# Patient Record
Sex: Male | Born: 1940 | Race: White | Hispanic: No | Marital: Married | Smoking: Never smoker
Health system: Southern US, Community
[De-identification: ages and names within clinical notes are randomized; demographics above are authoritative.]

## PROBLEM LIST (undated history)

## (undated) DIAGNOSIS — Z85828 Personal history of other malignant neoplasm of skin: Secondary | ICD-10-CM

## (undated) DIAGNOSIS — K219 Gastro-esophageal reflux disease without esophagitis: Secondary | ICD-10-CM

## (undated) DIAGNOSIS — R0981 Nasal congestion: Secondary | ICD-10-CM

## (undated) DIAGNOSIS — C801 Malignant (primary) neoplasm, unspecified: Secondary | ICD-10-CM

## (undated) DIAGNOSIS — E785 Hyperlipidemia, unspecified: Secondary | ICD-10-CM

## (undated) DIAGNOSIS — I10 Essential (primary) hypertension: Secondary | ICD-10-CM

## (undated) HISTORY — DX: Nasal congestion: R09.81

## (undated) HISTORY — DX: Gastro-esophageal reflux disease without esophagitis: K21.9

## (undated) HISTORY — DX: Personal history of other malignant neoplasm of skin: Z85.828

## (undated) HISTORY — DX: Essential (primary) hypertension: I10

## (undated) HISTORY — PX: DEBRIDEMENT TENNIS ELBOW: SHX1442

## (undated) HISTORY — DX: Malignant (primary) neoplasm, unspecified: C80.1

## (undated) HISTORY — DX: Hyperlipidemia, unspecified: E78.5

## (undated) HISTORY — PX: ROTATOR CUFF REPAIR: SHX139

---

## 1999-07-03 ENCOUNTER — Ambulatory Visit (HOSPITAL_COMMUNITY): Admission: RE | Admit: 1999-07-03 | Discharge: 1999-07-03 | Payer: Self-pay | Admitting: Internal Medicine

## 1999-07-03 ENCOUNTER — Encounter (INDEPENDENT_AMBULATORY_CARE_PROVIDER_SITE_OTHER): Payer: Self-pay | Admitting: Specialist

## 1999-07-09 ENCOUNTER — Other Ambulatory Visit: Admission: RE | Admit: 1999-07-09 | Discharge: 1999-07-09 | Payer: Self-pay | Admitting: Urology

## 2003-12-26 ENCOUNTER — Ambulatory Visit: Payer: Self-pay | Admitting: Family Medicine

## 2004-04-01 ENCOUNTER — Ambulatory Visit: Payer: Self-pay | Admitting: Family Medicine

## 2004-04-15 ENCOUNTER — Ambulatory Visit: Payer: Self-pay | Admitting: Family Medicine

## 2004-07-15 ENCOUNTER — Ambulatory Visit: Payer: Self-pay | Admitting: Family Medicine

## 2004-09-01 ENCOUNTER — Ambulatory Visit: Payer: Self-pay | Admitting: Family Medicine

## 2004-12-04 ENCOUNTER — Ambulatory Visit: Payer: Self-pay | Admitting: Family Medicine

## 2004-12-09 ENCOUNTER — Ambulatory Visit: Payer: Self-pay | Admitting: Family Medicine

## 2005-02-16 HISTORY — PX: SHOULDER SURGERY: SHX246

## 2005-04-14 ENCOUNTER — Ambulatory Visit: Payer: Self-pay | Admitting: Family Medicine

## 2005-06-15 ENCOUNTER — Ambulatory Visit: Payer: Self-pay | Admitting: Family Medicine

## 2005-09-23 ENCOUNTER — Ambulatory Visit: Payer: Self-pay | Admitting: Family Medicine

## 2005-10-07 ENCOUNTER — Ambulatory Visit (HOSPITAL_COMMUNITY): Admission: RE | Admit: 2005-10-07 | Discharge: 2005-10-07 | Payer: Self-pay | Admitting: Family Medicine

## 2006-02-01 ENCOUNTER — Encounter: Admission: RE | Admit: 2006-02-01 | Discharge: 2006-02-15 | Payer: Self-pay | Admitting: Orthopaedic Surgery

## 2006-02-16 ENCOUNTER — Encounter: Admission: RE | Admit: 2006-02-16 | Discharge: 2006-03-12 | Payer: Self-pay | Admitting: Orthopaedic Surgery

## 2006-07-08 ENCOUNTER — Ambulatory Visit: Payer: Self-pay | Admitting: Family Medicine

## 2006-10-20 ENCOUNTER — Ambulatory Visit: Payer: Self-pay | Admitting: Internal Medicine

## 2006-11-03 ENCOUNTER — Ambulatory Visit: Payer: Self-pay | Admitting: Internal Medicine

## 2006-11-03 ENCOUNTER — Encounter: Payer: Self-pay | Admitting: Internal Medicine

## 2010-07-04 NOTE — Procedures (Signed)
[place]  Patient:    Vincent Obrien, Vincent Obrien                           MRN: [id number] Proc. Date: 07/03/99 Adm. Date:  [id number] Disc. Date: [id number] Attending:  Estella Husk CC:         Delaney Meigs, M.D.             Verl Dicker, M.D.                           Procedure Report  PROCEDURE PERFORMED:  Colonoscopy with polypectomy.  ENDOSCOPIST:  Wilhemina Bonito. Eda Keys., M.D. [facility]  INDICATIONS FOR PROCEDURE: Colorectal neoplasia screening.  HISTORY:  The patient is a 70 year old gentleman in good health who was recently evaluated in the office for colorectal neoplasia screening.  This is after he was recently found to have a nodule on his prostate.  He is now for colonoscopy.  The nature of the procedure as well as the risks, benefits and alternatives were reviewed.   He understood and agreed to proceed.  PHYSICAL EXAMINATION:  The patient is a well-appearing male in no acute distress.  He is alert and oriented.  Vital signs are stable.  Lungs are clear.  Heart is regular.  Abdomen soft.  DESCRIPTION OF PROCEDURE:  After informed consent was obtained, the patient was sedated with 75 mg of Demerol and 7.5 mg of Versed IV.  Digital rectal was performed and found to be remarkable for the presence of a 5 mm firm, prostate nodule in the left lobe of the gland.  The Olympus colonoscope was then passed under direct vision per rectum and advanced through the entire length of the colon to the cecal tip.  Preparation was excellent.  Terminal ileum was intubated and appeared normal.  Careful examination of the colonic mucosa from the cecal tip to rectum revealed a 6 mm midascending colon polyp. This was removed with snare cautery and submitted for pathologic analysis.  Retroflex view of the rectum demonstrated internal hemorrhoids.  No other abnormalities.  IMPRESSION: 1. Diminutive colon polyp status post polypectomy. 2. Internal  hemorrhoids.  RECOMMENDATIONS: 1. Follow up pathology. 2. Repeat colonoscopy in three years if polyp adenomatous. 3. Avoid aspirin and nonsteroidal products for two weeks postpolypectomy. DD:  07/03/99 TD:  07/08/99 Job: [id number]

## 2011-11-20 ENCOUNTER — Encounter: Payer: Self-pay | Admitting: Internal Medicine

## 2012-05-13 ENCOUNTER — Encounter: Payer: Self-pay | Admitting: Internal Medicine

## 2012-06-28 ENCOUNTER — Ambulatory Visit (AMBULATORY_SURGERY_CENTER): Payer: PRIVATE HEALTH INSURANCE

## 2012-06-28 VITALS — Ht 66.0 in | Wt 154.8 lb

## 2012-06-28 DIAGNOSIS — Z1211 Encounter for screening for malignant neoplasm of colon: Secondary | ICD-10-CM

## 2012-06-28 DIAGNOSIS — Z860100 Personal history of colon polyps, unspecified: Secondary | ICD-10-CM

## 2012-06-28 MED ORDER — MOVIPREP 100 G PO SOLR: ORAL | Status: DC

## 2012-07-01 ENCOUNTER — Encounter: Payer: Self-pay | Admitting: Internal Medicine

## 2012-07-12 ENCOUNTER — Encounter: Payer: Self-pay | Admitting: Internal Medicine

## 2012-07-12 ENCOUNTER — Ambulatory Visit (AMBULATORY_SURGERY_CENTER): Payer: PRIVATE HEALTH INSURANCE | Admitting: Internal Medicine

## 2012-07-12 VITALS — BP 149/83 | HR 56 | Temp 98.3°F | Resp 17 | Ht 66.0 in | Wt 154.0 lb

## 2012-07-12 DIAGNOSIS — D126 Benign neoplasm of colon, unspecified: Secondary | ICD-10-CM

## 2012-07-12 DIAGNOSIS — Z1211 Encounter for screening for malignant neoplasm of colon: Secondary | ICD-10-CM

## 2012-07-12 DIAGNOSIS — Z860100 Personal history of colon polyps, unspecified: Secondary | ICD-10-CM

## 2012-07-12 MED ORDER — SODIUM CHLORIDE 0.9 % IV SOLN: 500.0000 mL | INTRAVENOUS | Status: DC

## 2012-07-12 NOTE — Patient Instructions (Addendum)

## 2012-07-12 NOTE — Op Note (Signed)
[facility] [address] [place], [address]   COLONOSCOPY PROCEDURE REPORT  PATIENT: Vincent Obrien, Vincent Obrien  MR#: [id number] BIRTHDATE: [birth year] , 72  yrs. old GENDER: Male ENDOSCOPIST: Roxy Cedar, MD REFERRED IR:[id number] Program Recall PROCEDURE DATE:  07/12/2012 PROCEDURE:   Colonoscopy with snare polypectomy x 1 ASA CLASS:   Class II INDICATIONS:Patient's personal history of adenomatous colon polyps. Prior exams 2001, 04, 08 (TAs) MEDICATIONS: MAC sedation, administered by CRNA and propofol (Diprivan) 200mg  IV  DESCRIPTION OF PROCEDURE:   After the risks benefits and alternatives of the procedure were thoroughly explained, informed consent was obtained.  A digital rectal exam revealed no abnormalities of the rectum.   The LB [id number]  endoscope was introduced through the anus and advanced to the cecum, which was identified by both the appendix and ileocecal valve. No adverse events experienced.   The quality of the prep was excellent, using MoviPrep  The instrument was then slowly withdrawn as the colon was fully examined.      COLON FINDINGS: A diminutive polyp was found in the ascending colon. A polypectomy was performed with a cold snare.  The resection was complete and the polyp tissue was completely retrieved.   Mild diverticulosis was noted in the sigmoid colon.   The colon mucosa was otherwise normal.  Retroflexed views revealed internal hemorrhoids. The time to cecum=3 minutes 0 seconds.  Withdrawal time=8 minutes 35 seconds.  The scope was withdrawn and the procedure completed. COMPLICATIONS: There were no complications.  ENDOSCOPIC IMPRESSION: 1.   Diminutive polyp was found in the ascending colon; polypectomy was performed with a cold snare 2.   Mild diverticulosis was noted in the sigmoid colon 3.   The colon mucosa was otherwise normal  RECOMMENDATIONS: 1. Consider Follow up colonoscopy in 5 years if medically  fit   eSigned:  Roxy Cedar, MD 07/12/2012 11:14 AM   cc: Joette Catching, MD and The Patient   PATIENT NAME:  Vincent Obrien, Vincent Obrien MR#: [id number]

## 2012-07-12 NOTE — Progress Notes (Signed)
Patient did not experience any of the following events: a burn prior to discharge; a fall within the facility; wrong site/side/patient/procedure/implant event; or a hospital transfer or hospital admission upon discharge from the facility. (G8907) Patient did not have preoperative order for IV antibiotic SSI prophylaxis. (G8918)  

## 2012-07-12 NOTE — Progress Notes (Signed)
NO EGG OR SOY ALLERGY. EWM 

## 2012-07-12 NOTE — Progress Notes (Signed)
Called to room to assist during endoscopic procedure.  Patient ID and intended procedure confirmed with present staff. Received instructions for my participation in the procedure from the performing physician.  

## 2012-07-18 ENCOUNTER — Encounter: Payer: Self-pay | Admitting: Internal Medicine

## 2013-09-04 DIAGNOSIS — E785 Hyperlipidemia, unspecified: Secondary | ICD-10-CM | POA: Insufficient documentation

## 2013-09-04 DIAGNOSIS — I129 Hypertensive chronic kidney disease with stage 1 through stage 4 chronic kidney disease, or unspecified chronic kidney disease: Secondary | ICD-10-CM | POA: Insufficient documentation

## 2013-10-31 ENCOUNTER — Encounter: Payer: Self-pay | Admitting: Internal Medicine

## 2015-04-01 DIAGNOSIS — H903 Sensorineural hearing loss, bilateral: Secondary | ICD-10-CM | POA: Insufficient documentation

## 2015-10-07 DIAGNOSIS — K219 Gastro-esophageal reflux disease without esophagitis: Secondary | ICD-10-CM | POA: Insufficient documentation

## 2016-01-01 ENCOUNTER — Other Ambulatory Visit: Payer: Self-pay | Admitting: Urology

## 2016-01-01 ENCOUNTER — Ambulatory Visit (INDEPENDENT_AMBULATORY_CARE_PROVIDER_SITE_OTHER): Payer: Medicare Other | Admitting: Urology

## 2016-01-01 DIAGNOSIS — R972 Elevated prostate specific antigen [PSA]: Secondary | ICD-10-CM

## 2016-01-01 DIAGNOSIS — N5201 Erectile dysfunction due to arterial insufficiency: Secondary | ICD-10-CM

## 2016-01-29 ENCOUNTER — Ambulatory Visit (HOSPITAL_COMMUNITY)
Admission: RE | Admit: 2016-01-29 | Discharge: 2016-01-29 | Disposition: A | Discharge status: Home / Self Care | Payer: Medicare Other | Source: Ambulatory Visit | Attending: Urology | Admitting: Urology

## 2016-01-29 ENCOUNTER — Ambulatory Visit (HOSPITAL_COMMUNITY): Admission: RE | Admit: 2016-01-29 | Payer: Medicare Other | Source: Ambulatory Visit

## 2016-01-29 DIAGNOSIS — C61 Malignant neoplasm of prostate: Secondary | ICD-10-CM | POA: Diagnosis not present

## 2016-01-29 DIAGNOSIS — R972 Elevated prostate specific antigen [PSA]: Secondary | ICD-10-CM | POA: Insufficient documentation

## 2016-01-29 MED ORDER — GENTAMICIN SULFATE 40 MG/ML IJ SOLN
INTRAMUSCULAR | Status: AC
  Administered 2016-01-29: 80 mg via INTRAMUSCULAR
  Filled 2016-01-29: qty 2

## 2016-01-29 MED ORDER — LIDOCAINE HCL (PF) 2 % IJ SOLN
INTRAMUSCULAR | Status: AC
  Administered 2016-01-29: 10 mL
  Filled 2016-01-29: qty 10

## 2016-01-29 MED ORDER — GENTAMICIN SULFATE 40 MG/ML IJ SOLN
160.0000 mg | Freq: Once | INTRAMUSCULAR | Status: AC
  Administered 2016-01-29: 80 mg via INTRAMUSCULAR

## 2016-01-29 NOTE — Discharge Instructions (Signed)
Transrectal Ultrasound-Guided Biopsy °A transrectal ultrasound-guided biopsy is a procedure to take samples of tissue from your prostate. Ultrasound images are used to guide the procedure. It is usually done to check the prostate gland for cancer. °What happens before the procedure? °· Do not eat or drink after midnight on the night before your procedure. °· Take medicines as your doctor tells you. °· Your doctor may have you stop taking some medicines 5-7 days before the procedure. °· You will be given an enema before your procedure. During an enema, a liquid is put into your butt (rectum) to clear out waste. °· You may have lab tests the day of your procedure. °· Make plans to have someone drive you home. °What happens during the procedure? °· You will be given medicine to help you relax before the procedure. An IV tube will be put into one of your veins. It will be used to give fluids and medicine. °· You will be given medicine to reduce the risk of infection (antibiotic). °· You will be placed on your side. °· A probe with gel will be put in your butt. This is used to take pictures of your prostate and the area around it. °· A medicine to numb the area is put into your prostate. °· A biopsy needle is then inserted and guided to your prostate. °· Samples of prostate tissue are taken. The needle is removed. °· The samples are sent to a lab to be checked. Results are usually back in 2-3 days. °What happens after the procedure? °· You will be taken to a room where you will be watched until you are doing okay. °· You may have some pain in the area around your butt. You will be given medicines for this. °· You may be able to go home the same day. Sometimes, an overnight stay in the hospital is needed. °This information is not intended to replace advice given to you by your health care provider. Make sure you discuss any questions you have with your health care provider. °Document Released: 01/21/2009 Document Revised:  07/11/2015 Document Reviewed: 09/21/2012 °Elsevier Interactive Patient Education © 2017 Elsevier Inc. ° °

## 2016-02-26 ENCOUNTER — Ambulatory Visit (INDEPENDENT_AMBULATORY_CARE_PROVIDER_SITE_OTHER): Payer: Medicare Other | Admitting: Urology

## 2016-02-26 DIAGNOSIS — C61 Malignant neoplasm of prostate: Secondary | ICD-10-CM | POA: Diagnosis not present

## 2016-03-03 ENCOUNTER — Other Ambulatory Visit: Payer: Self-pay | Admitting: Urology

## 2016-03-03 DIAGNOSIS — C61 Malignant neoplasm of prostate: Secondary | ICD-10-CM

## 2016-03-10 ENCOUNTER — Encounter (HOSPITAL_COMMUNITY)
Admission: RE | Admit: 2016-03-10 | Discharge: 2016-03-10 | Disposition: A | Discharge status: Home / Self Care | Payer: Medicare Other | Source: Ambulatory Visit | Attending: Urology | Admitting: Urology

## 2016-03-10 ENCOUNTER — Encounter (HOSPITAL_COMMUNITY): Payer: Self-pay

## 2016-03-10 DIAGNOSIS — C61 Malignant neoplasm of prostate: Secondary | ICD-10-CM | POA: Diagnosis present

## 2016-03-10 MED ORDER — TECHNETIUM TC 99M MEDRONATE IV KIT
20.0000 | PACK | Freq: Once | INTRAVENOUS | Status: AC | PRN
  Administered 2016-03-10: 21.5 via INTRAVENOUS

## 2016-03-17 ENCOUNTER — Ambulatory Visit (HOSPITAL_COMMUNITY)
Admission: RE | Admit: 2016-03-17 | Discharge: 2016-03-17 | Disposition: A | Discharge status: Home / Self Care | Payer: Medicare Other | Source: Ambulatory Visit | Attending: Urology | Admitting: Urology

## 2016-03-17 DIAGNOSIS — C61 Malignant neoplasm of prostate: Secondary | ICD-10-CM | POA: Insufficient documentation

## 2016-03-17 MED ORDER — IOPAMIDOL (ISOVUE-300) INJECTION 61%
100.0000 mL | Freq: Once | INTRAVENOUS | Status: AC | PRN
  Administered 2016-03-17: 100 mL via INTRAVENOUS

## 2016-03-17 MED ORDER — BARIUM SULFATE 2.1 % PO SUSP
ORAL | Status: AC
  Filled 2016-03-17: qty 2

## 2016-04-03 ENCOUNTER — Other Ambulatory Visit: Payer: Self-pay | Admitting: Urology

## 2016-04-03 ENCOUNTER — Telehealth: Payer: Self-pay | Admitting: *Deleted

## 2016-04-03 DIAGNOSIS — C61 Malignant neoplasm of prostate: Secondary | ICD-10-CM

## 2016-04-03 NOTE — Telephone Encounter (Signed)
CALLED PATIENT TO INFORM OF SIM ON 05-29-16 @ 3 PM @ DR. MANNING'S OFFICE, LVM  FOR A RETURN CALL

## 2016-04-10 ENCOUNTER — Telehealth: Payer: Self-pay | Admitting: Urology

## 2016-04-10 NOTE — Telephone Encounter (Signed)
I have made multiple attempts to contact Mr Haneline without success. VM has not previously been available but I was able to leave a VM on patients home phone today requesting that he return my call to clarify who he has seen in [place]. Onc previously as we do not have any documentation within our system to indicate that he has been seen by Dr. Tammi Klippel.  It is suspected that he has been seen by Dr. Lianne Cure in [place] but need to confirm.  I will await his return call.  -Dyanara Cozza

## 2016-05-06 ENCOUNTER — Ambulatory Visit (INDEPENDENT_AMBULATORY_CARE_PROVIDER_SITE_OTHER): Payer: Medicare Other | Admitting: Urology

## 2016-05-06 DIAGNOSIS — C61 Malignant neoplasm of prostate: Secondary | ICD-10-CM | POA: Diagnosis not present

## 2016-05-11 ENCOUNTER — Other Ambulatory Visit: Payer: Self-pay | Admitting: Urology

## 2016-05-11 DIAGNOSIS — C61 Malignant neoplasm of prostate: Secondary | ICD-10-CM

## 2016-05-13 ENCOUNTER — Ambulatory Visit (HOSPITAL_COMMUNITY)
Admission: RE | Admit: 2016-05-13 | Discharge: 2016-05-13 | Disposition: A | Discharge status: Home / Self Care | Payer: Medicare Other | Source: Ambulatory Visit | Attending: Urology | Admitting: Urology

## 2016-05-13 DIAGNOSIS — C61 Malignant neoplasm of prostate: Secondary | ICD-10-CM | POA: Diagnosis not present

## 2016-05-13 MED ORDER — GENTAMICIN SULFATE 40 MG/ML IJ SOLN
INTRAMUSCULAR | Status: AC
  Administered 2016-05-13: 80 mg via INTRAMUSCULAR
  Filled 2016-05-13: qty 2

## 2016-05-13 MED ORDER — LIDOCAINE HCL (PF) 2 % IJ SOLN
INTRAMUSCULAR | Status: AC
  Administered 2016-05-13: 10 mL
  Filled 2016-05-13: qty 10

## 2016-05-13 MED ORDER — GENTAMICIN SULFATE 40 MG/ML IJ SOLN
80.0000 mg | Freq: Once | INTRAMUSCULAR | Status: AC
  Administered 2016-05-13: 80 mg via INTRAMUSCULAR

## 2016-05-13 MED ORDER — LIDOCAINE HCL (PF) 2 % IJ SOLN
10.0000 mL | Freq: Once | INTRAMUSCULAR | Status: AC
  Administered 2016-05-13: 10 mL

## 2016-05-13 NOTE — Discharge Instructions (Signed)
Transrectal Ultrasound-Guided Biopsy °A transrectal ultrasound-guided biopsy is a procedure to take samples of tissue from your prostate. Ultrasound images are used to guide the procedure. It is usually done to check the prostate gland for cancer. °What happens before the procedure? °· Do not eat or drink after midnight on the night before your procedure. °· Take medicines as your doctor tells you. °· Your doctor may have you stop taking some medicines 5-7 days before the procedure. °· You will be given an enema before your procedure. During an enema, a liquid is put into your butt (rectum) to clear out waste. °· You may have lab tests the day of your procedure. °· Make plans to have someone drive you home. °What happens during the procedure? °· You will be given medicine to help you relax before the procedure. An IV tube will be put into one of your veins. It will be used to give fluids and medicine. °· You will be given medicine to reduce the risk of infection (antibiotic). °· You will be placed on your side. °· A probe with gel will be put in your butt. This is used to take pictures of your prostate and the area around it. °· A medicine to numb the area is put into your prostate. °· A biopsy needle is then inserted and guided to your prostate. °· Samples of prostate tissue are taken. The needle is removed. °· The samples are sent to a lab to be checked. Results are usually back in 2-3 days. °What happens after the procedure? °· You will be taken to a room where you will be watched until you are doing okay. °· You may have some pain in the area around your butt. You will be given medicines for this. °· You may be able to go home the same day. Sometimes, an overnight stay in the hospital is needed. °This information is not intended to replace advice given to you by your health care provider. Make sure you discuss any questions you have with your health care provider. °Document Released: 01/21/2009 Document Revised:  07/11/2015 Document Reviewed: 09/21/2012 °Elsevier Interactive Patient Education © 2017 Elsevier Inc. ° °

## 2016-05-29 ENCOUNTER — Other Ambulatory Visit: Payer: Self-pay

## 2016-06-03 ENCOUNTER — Ambulatory Visit (INDEPENDENT_AMBULATORY_CARE_PROVIDER_SITE_OTHER): Payer: Medicare Other | Admitting: Urology

## 2016-06-03 DIAGNOSIS — C61 Malignant neoplasm of prostate: Secondary | ICD-10-CM | POA: Diagnosis not present

## 2016-08-05 ENCOUNTER — Ambulatory Visit (INDEPENDENT_AMBULATORY_CARE_PROVIDER_SITE_OTHER): Payer: Medicare Other | Admitting: Urology

## 2016-08-05 DIAGNOSIS — C61 Malignant neoplasm of prostate: Secondary | ICD-10-CM

## 2016-11-18 ENCOUNTER — Ambulatory Visit (INDEPENDENT_AMBULATORY_CARE_PROVIDER_SITE_OTHER): Payer: Medicare Other | Admitting: Urology

## 2016-11-18 DIAGNOSIS — C61 Malignant neoplasm of prostate: Secondary | ICD-10-CM

## 2017-03-03 ENCOUNTER — Ambulatory Visit (INDEPENDENT_AMBULATORY_CARE_PROVIDER_SITE_OTHER): Payer: Medicare Other | Admitting: Urology

## 2017-03-03 DIAGNOSIS — N400 Enlarged prostate without lower urinary tract symptoms: Secondary | ICD-10-CM

## 2017-03-03 DIAGNOSIS — N401 Enlarged prostate with lower urinary tract symptoms: Secondary | ICD-10-CM

## 2017-03-03 DIAGNOSIS — C61 Malignant neoplasm of prostate: Secondary | ICD-10-CM

## 2017-03-03 DIAGNOSIS — N5201 Erectile dysfunction due to arterial insufficiency: Secondary | ICD-10-CM

## 2017-06-09 ENCOUNTER — Ambulatory Visit (INDEPENDENT_AMBULATORY_CARE_PROVIDER_SITE_OTHER): Payer: Medicare Other | Admitting: Urology

## 2017-06-09 DIAGNOSIS — C61 Malignant neoplasm of prostate: Secondary | ICD-10-CM

## 2017-06-09 DIAGNOSIS — N5201 Erectile dysfunction due to arterial insufficiency: Secondary | ICD-10-CM

## 2017-06-09 DIAGNOSIS — N400 Enlarged prostate without lower urinary tract symptoms: Secondary | ICD-10-CM | POA: Diagnosis not present

## 2017-06-09 DIAGNOSIS — N401 Enlarged prostate with lower urinary tract symptoms: Secondary | ICD-10-CM

## 2017-06-23 ENCOUNTER — Encounter: Payer: Self-pay | Admitting: Internal Medicine

## 2017-08-27 ENCOUNTER — Ambulatory Visit (INDEPENDENT_AMBULATORY_CARE_PROVIDER_SITE_OTHER): Payer: Medicare Other | Admitting: Urology

## 2017-08-27 ENCOUNTER — Other Ambulatory Visit (HOSPITAL_COMMUNITY)
Admission: RE | Admit: 2017-08-27 | Discharge: 2017-08-27 | Disposition: A | Discharge status: Home / Self Care | Payer: Medicare Other | Source: Other Acute Inpatient Hospital | Attending: Urology | Admitting: Urology

## 2017-08-27 DIAGNOSIS — R310 Gross hematuria: Secondary | ICD-10-CM

## 2017-08-27 DIAGNOSIS — C61 Malignant neoplasm of prostate: Secondary | ICD-10-CM | POA: Diagnosis not present

## 2017-08-28 LAB — URINE CULTURE: CULTURE: NO GROWTH

## 2017-08-31 ENCOUNTER — Other Ambulatory Visit: Payer: Self-pay | Admitting: Urology

## 2017-08-31 DIAGNOSIS — R310 Gross hematuria: Secondary | ICD-10-CM

## 2017-09-03 IMAGING — NM NM BONE WHOLE BODY
4 series · 4 of 4 positions shown · non-contrast
Comparison: None.

CLINICAL DATA: Prostate cancer.

EXAM:
NUCLEAR MEDICINE WHOLE BODY BONE SCAN
TECHNIQUE: Whole body anterior and posterior images were obtained approximately
3 hours after intravenous injection of radiopharmaceutical.
RADIOPHARMACEUTICALS:  21.5 mCi Lechnetium-GGm MDP IV

[Series 1: whole body · 2.66mm/px · 1 of 1 slices shown (1 of 2)]
[im 1/1]
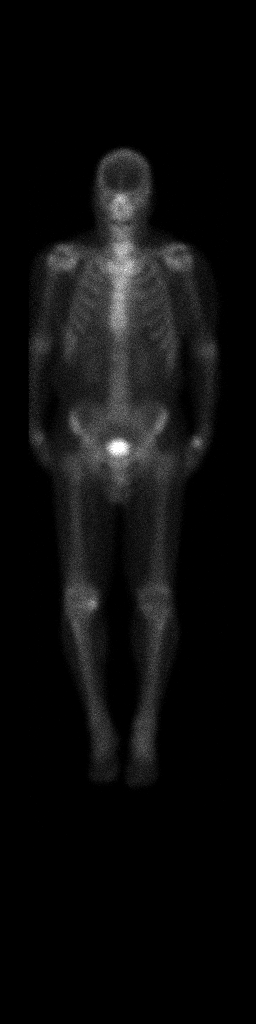

[Series 1: wbr_bone_60 whole body · 2.66mm/px · 1 of 1 slices shown (1 of 2)]
[im 1/1]
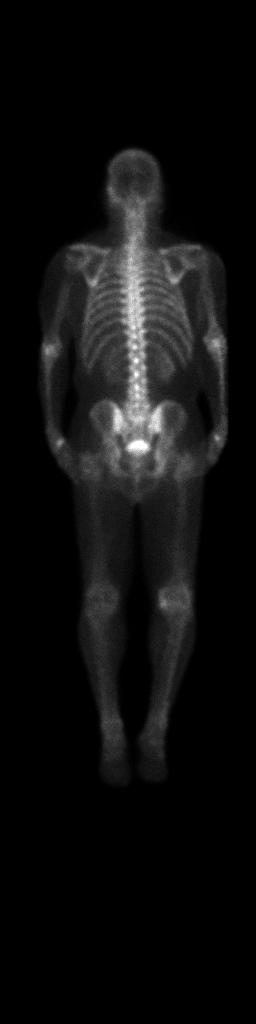

[Series 1: wbr_bone_60 whole body · 2.66mm/px · 1 of 1 slices shown (2 of 2)]
[im 1/1]
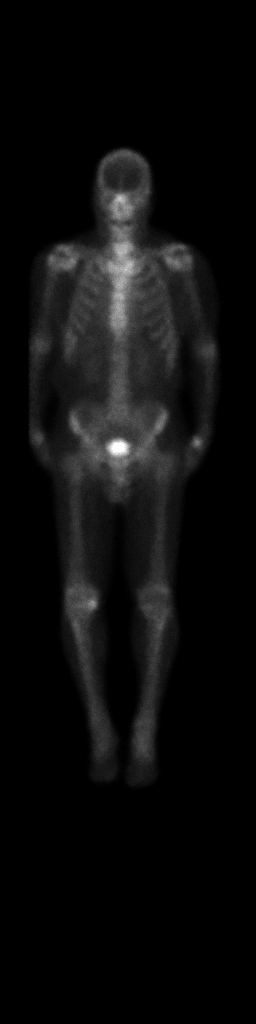

[Series 1: whole body · 2.66mm/px · 1 of 1 slices shown (2 of 2)]
[im 1/1]
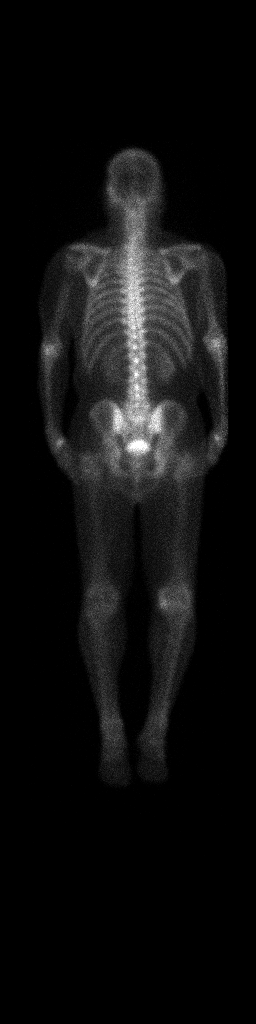

[4 of 4 positions shown; findings below may reference images not displayed]

FINDINGS: No areas of focal bony uptake to suggest osseous metastatic disease.
Degenerative uptake noted in the wrists AAA and left knee.
IMPRESSION: No suspicious areas for bony metastatic disease.

## 2017-09-07 ENCOUNTER — Ambulatory Visit (HOSPITAL_COMMUNITY)
Admission: RE | Admit: 2017-09-07 | Discharge: 2017-09-07 | Disposition: A | Discharge status: Home / Self Care | Payer: Medicare Other | Source: Ambulatory Visit | Attending: Urology | Admitting: Urology

## 2017-09-07 DIAGNOSIS — N400 Enlarged prostate without lower urinary tract symptoms: Secondary | ICD-10-CM | POA: Diagnosis not present

## 2017-09-07 DIAGNOSIS — R310 Gross hematuria: Secondary | ICD-10-CM | POA: Diagnosis not present

## 2017-09-07 LAB — POCT I-STAT CREATININE: Creatinine, Ser: 1.4 mg/dL — ABNORMAL HIGH (ref 0.61–1.24)

## 2017-09-07 MED ORDER — IOPAMIDOL (ISOVUE-300) INJECTION 61%
125.0000 mL | Freq: Once | INTRAVENOUS | Status: AC | PRN
  Administered 2017-09-07: 13:00:00 via INTRAVENOUS

## 2017-09-08 ENCOUNTER — Ambulatory Visit (INDEPENDENT_AMBULATORY_CARE_PROVIDER_SITE_OTHER): Payer: Medicare Other | Admitting: Urology

## 2017-09-08 DIAGNOSIS — C61 Malignant neoplasm of prostate: Secondary | ICD-10-CM

## 2017-09-08 DIAGNOSIS — R310 Gross hematuria: Secondary | ICD-10-CM | POA: Diagnosis not present

## 2017-12-27 ENCOUNTER — Other Ambulatory Visit: Payer: Self-pay

## 2017-12-27 ENCOUNTER — Ambulatory Visit: Payer: Medicare Other | Attending: Orthopaedic Surgery | Admitting: Physical Therapy

## 2017-12-27 ENCOUNTER — Encounter: Payer: Self-pay | Admitting: Physical Therapy

## 2017-12-27 DIAGNOSIS — R600 Localized edema: Secondary | ICD-10-CM | POA: Diagnosis present

## 2017-12-27 DIAGNOSIS — G8929 Other chronic pain: Secondary | ICD-10-CM

## 2017-12-27 DIAGNOSIS — M25561 Pain in right knee: Secondary | ICD-10-CM | POA: Diagnosis not present

## 2017-12-27 DIAGNOSIS — M25661 Stiffness of right knee, not elsewhere classified: Secondary | ICD-10-CM | POA: Diagnosis present

## 2017-12-27 NOTE — Therapy (Signed)
[facility] [place], [place], [address] Phone: [phone]   Fax:  [phone]  Physical Therapy Evaluation  Patient Details  Name: Vincent Obrien MRN: [id number] Date of Birth: November 26, 1940 Referring Provider (PT): Frazier Butt MD.   Encounter Date: 12/27/2017  PT End of Session - 12/27/17 1113    Visit Number  1    Number of Visits  12    Date for PT Re-Evaluation  03/27/18    Authorization Type  FOTO AT LEAST EVERY 5TH VISIT, 10TH VISIT PROGRESS NOTE AND KX MODIFIER AFTER THE 15 VISIT.    PT Start Time  1024    PT Stop Time  1111    PT Time Calculation (min)  47 min    Activity Tolerance  Patient tolerated treatment well    Behavior During Therapy  WFL for tasks assessed/performed       Past Medical History:  Diagnosis Date  . Cancer [facility])    SKIN CANCER  . GERD (gastroesophageal reflux disease)   . History of skin cancer    left ear  . Hyperlipidemia   . Hypertension   . Sinus congestion     Past Surgical History:  Procedure Laterality Date  . DEBRIDEMENT TENNIS ELBOW     arthroscopic surg left arm  . ROTATOR CUFF REPAIR     right  . SHOULDER SURGERY  2007   torn ligament left shoulder    There were no vitals filed for this visit.   Subjective Assessment - 12/27/17 1122    Subjective  The patient presents to the clinic today s/p right total knee replacemetn performed on 12/07/17.  he is very pleased with his progress thus far.  His pain is about a 4/10 today.  Ice decreases pain and increased activity increases pain.    Pertinent History  Shoulder and elbow surgery.  Cochlear implant.    How long can you walk comfortably?  Short community distances.    Patient Stated Goals  [name] back to normal.    Currently in Pain?  Yes    Pain Score  4     Pain Location  Knee    Pain Orientation  Right    Pain Descriptors / Indicators  Aching;Discomfort;Dull    Pain Type  Surgical pain    Pain Onset  1 to  4 weeks ago    Pain Frequency  Constant    Aggravating Factors   See above.    Pain Relieving Factors  See above.         [facility] PT Assessment - 12/27/17 0001      Assessment   Medical Diagnosis  Right total knee replacement.    Referring Provider (PT)  Frazier Butt MD.    Onset Date/Surgical Date  --   12/07/17 (surgery date).     Precautions   Precautions  --   No ultrasound.     Restrictions   Weight Bearing Restrictions  No      Balance Screen   Has the patient fallen in the past 6 months  No    Has the patient had a decrease in activity level because of a fear of falling?   No    Is the patient reluctant to leave their home because of a fear of falling?   No      Home Film/video editor residence      Prior Function   Level of Independence  Independent  Observation/Other Assessments   Observations  Right knee steri-strips intac with several beginning to fall off.    Focus on Therapeutic Outcomes (FOTO)   61% limitation.      Observation/Other Assessments-Edema    Edema  Circumferential      Circumferential Edema   Circumferential - Right  RT 4.5 cms > LT.      ROM / Strength   AROM / PROM / Strength  AROM;Strength      AROM   Overall AROM Comments  Full extension and flexion to 108 degrees.      Strength   Overall Strength Comments  Right hip flexion/abduction and knee= 4+/5.      Palpation   Palpation comment  Mild tenderness around right knee and excellent patellar mobs.      Transfers   Transfers  --   Independent.     Ambulation/Gait   Gait Comments  Patient's gait cycle is essentially normal.                Objective measurements completed on examination: See above findings.      [facility] Adult PT Treatment/Exercise - 12/27/17 0001      Exercises   Exercises  Knee/Hip      Knee/Hip Exercises: Aerobic   Recumbent Bike  Level 1 at seat 3 x 10 minutes.      Modalities   Modalities   Vasopneumatic      Vasopneumatic   Number Minutes Vasopneumatic   10 minutes    Vasopnuematic Location   --   Right knee.   Vasopneumatic Pressure  Low               PT Short Term Goals - 12/27/17 1157      PT SHORT TERM GOAL #1   Title  STG's=LTG's.        PT Long Term Goals - 12/27/17 1158      PT LONG TERM GOAL #1   Title  Independent with a HEP.    Time  6    Period  Weeks    Status  New      PT LONG TERM GOAL #2   Title  Active right knee flexion to 125 degrees+ so the patient can perform functional tasks and do so with pain not > 2-3/10.    Time  6    Period  Weeks    Status  New      PT LONG TERM GOAL #3   Title  Increase right hip and knee strength to a solid 5/5 to provide good stability for accomplishment of functional activities.    Time  6    Period  Weeks    Status  New      PT LONG TERM GOAL #4   Title  Decrease edema to within 2 cms of non-affected side to assist with pain reduction and range of motion gains.    Time  6    Period  Weeks    Status  New      PT LONG TERM GOAL #5   Title  Perform a reciprocating stair gait with one railing with pain not > 2-3/10.    Time  6    Period  Weeks    Status  New             Plan - 12/27/17 1129    Clinical Impression Statement  The patient presents to OPPT s/p right total knee replacement performed on 12/07/17.  She  is doing very well thus far.  He is walking without assistive device.  He has full right knee extension and flexion to 110 degrees.  His knee has an expected amount of edema. Patient will benefit from skilled physical therapy intervention to address deficits.     History and Personal Factors relevant to plan of care:  Shoulder and elbow surgery.  Cochlear implant.    Clinical Presentation  Stable    Clinical Presentation due to:  Very good surgical outcome.    Clinical Decision Making  Low    Rehab Potential  Excellent    PT Frequency  2x / week    PT Duration  6 weeks    PT  Treatment/Interventions  ADLs/Self Care Home Management;Cryotherapy;Glass blower/designer;Therapeutic activities;Therapeutic exercise;Patient/family education;Neuromuscular re-education;Manual techniques;Passive range of motion;Vasopneumatic Device    PT Next Visit Plan  Stationary bike; strengthening progression.  Vasopneumatic and electrical stimulation.    Consulted and Agree with Plan of Care  Patient       Patient will benefit from skilled therapeutic intervention in order to improve the following deficits and impairments:  Pain, Decreased activity tolerance, Decreased range of motion, Decreased strength, Increased edema  Visit Diagnosis: Chronic pain of right knee - Plan: PT plan of care cert/re-cert  Stiffness of right knee, not elsewhere classified - Plan: PT plan of care cert/re-cert  Localized edema - Plan: PT plan of care cert/re-cert     Problem List There are no active problems to display for this patient.   Kiowa Peifer, [name]  MPT 12/27/2017, 12:02 PM  [facility] [address] [place], [place], [address] Phone: [phone]   Fax:  [phone]  Name: Vincent Obrien MRN: [id number] Date of Birth: February 02, 1941

## 2017-12-30 ENCOUNTER — Ambulatory Visit: Payer: Medicare Other | Admitting: *Deleted

## 2017-12-30 DIAGNOSIS — M25561 Pain in right knee: Secondary | ICD-10-CM | POA: Diagnosis not present

## 2017-12-30 DIAGNOSIS — G8929 Other chronic pain: Secondary | ICD-10-CM

## 2017-12-30 DIAGNOSIS — R600 Localized edema: Secondary | ICD-10-CM

## 2017-12-30 DIAGNOSIS — M25661 Stiffness of right knee, not elsewhere classified: Secondary | ICD-10-CM

## 2017-12-30 NOTE — Therapy (Signed)
[facility] [place], [place], [address] Phone: [phone]   Fax:  [phone]  Physical Therapy Treatment  Patient Details  Name: Vincent Obrien MRN: [id number] Date of Birth: February 12, 1941 Referring Provider (PT): Frazier Butt MD.   Encounter Date: 12/30/2017  PT End of Session - 12/30/17 1528    Visit Number  2    Number of Visits  12    Date for PT Re-Evaluation  03/27/18    Authorization Type  FOTO AT LEAST EVERY 5TH VISIT, 10TH VISIT PROGRESS NOTE AND KX MODIFIER AFTER THE 15 VISIT.    PT Start Time  1430    PT Stop Time  1520    PT Time Calculation (min)  50 min       Past Medical History:  Diagnosis Date  . Cancer [facility])    SKIN CANCER  . GERD (gastroesophageal reflux disease)   . History of skin cancer    left ear  . Hyperlipidemia   . Hypertension   . Sinus congestion     Past Surgical History:  Procedure Laterality Date  . DEBRIDEMENT TENNIS ELBOW     arthroscopic surg left arm  . ROTATOR CUFF REPAIR     right  . SHOULDER SURGERY  2007   torn ligament left shoulder    There were no vitals filed for this visit.  Subjective Assessment - 12/30/17 1516    Subjective  Pt arrived today with minimal complaints about RT knee.    Pertinent History  Shoulder and elbow surgery.  Cochlear implant.         Sx 12-07-17 RT TKR    How long can you walk comfortably?  Short community distances.    Pain Location  Knee    Pain Orientation  Right    Pain Descriptors / Indicators  Aching    Pain Type  Surgical pain    Pain Onset  1 to 4 weeks ago    Pain Frequency  Constant                       [facility] Adult PT Treatment/Exercise - 12/30/17 0001      Exercises   Exercises  Knee/Hip      Knee/Hip Exercises: Aerobic   Recumbent Bike  Level 1 at seat4- 3 x 15 minutes.      Knee/Hip Exercises: Standing   Heel Raises  --    Lateral Step Up  2 sets;10 reps;Step Height: 4"    Forward Step Up   2 sets;10 reps;Step Height: 4"      Knee/Hip Exercises: Seated   Long Arc Quad  Strengthening;Right;3 sets;10 reps    Long Arc Quad Weight  3 lbs.      Modalities   Modalities  Vasopneumatic      Vasopneumatic   Number Minutes Vasopneumatic   15 minutes    Vasopnuematic Location   Knee    Vasopneumatic Pressure  Low    Vasopneumatic Temperature   36      Manual Therapy   Manual Therapy  Soft tissue mobilization;Passive ROM    Passive ROM  PROM for flexion and extension  as well as patella mobs               PT Short Term Goals - 12/27/17 1157      PT SHORT TERM GOAL #1   Title  STG's=LTG's.        PT Long Term Goals -  12/27/17 1158      PT LONG TERM GOAL #1   Title  Independent with a HEP.    Time  6    Period  Weeks    Status  New      PT LONG TERM GOAL #2   Title  Active right knee flexion to 125 degrees+ so the patient can perform functional tasks and do so with pain not > 2-3/10.    Time  6    Period  Weeks    Status  New      PT LONG TERM GOAL #3   Title  Increase right hip and knee strength to a solid 5/5 to provide good stability for accomplishment of functional activities.    Time  6    Period  Weeks    Status  New      PT LONG TERM GOAL #4   Title  Decrease edema to within 2 cms of non-affected side to assist with pain reduction and range of motion gains.    Time  6    Period  Weeks    Status  New      PT LONG TERM GOAL #5   Title  Perform a reciprocating stair gait with one railing with pain not > 2-3/10.    Time  6    Period  Weeks    Status  New            Plan - 12/30/17 1420    Clinical Impression Statement  Pt arrived today with minimal pain RT knee. He was able to perform LE exs with minimal complaints and his ROM was great at 0-110 degrees. He was able to perform LAQs and step ups with good quad control. Normal Vaso response.    Clinical Presentation  Stable    Clinical Decision Making  Low    Rehab Potential  Excellent     PT Frequency  2x / week    PT Duration  6 weeks    PT Treatment/Interventions  ADLs/Self Care Home Management;Cryotherapy;Glass blower/designer;Therapeutic activities;Therapeutic exercise;Patient/family education;Neuromuscular re-education;Manual techniques;Passive range of motion;Vasopneumatic Device    PT Next Visit Plan  Stationary bike; strengthening progression.  Vasopneumatic and electrical stimulation.    Consulted and Agree with Plan of Care  Patient       Patient will benefit from skilled therapeutic intervention in order to improve the following deficits and impairments:  Pain, Decreased activity tolerance, Decreased range of motion, Decreased strength, Increased edema  Visit Diagnosis: Chronic pain of right knee  Stiffness of right knee, not elsewhere classified  Localized edema     Problem List There are no active problems to display for this patient.   Chane Magner,CHRIS, PTA 12/30/2017, 3:35 PM  [facility] [address] [place], [place], [address] Phone: [phone]   Fax:  [phone]  Name: OTTO FELKINS MRN: [id number] Date of Birth: 19-Feb-1940

## 2018-01-03 ENCOUNTER — Ambulatory Visit: Payer: Medicare Other | Admitting: Physical Therapy

## 2018-01-03 ENCOUNTER — Encounter: Payer: Self-pay | Admitting: Physical Therapy

## 2018-01-03 DIAGNOSIS — M25561 Pain in right knee: Principal | ICD-10-CM

## 2018-01-03 DIAGNOSIS — R600 Localized edema: Secondary | ICD-10-CM

## 2018-01-03 DIAGNOSIS — G8929 Other chronic pain: Secondary | ICD-10-CM

## 2018-01-03 DIAGNOSIS — M25661 Stiffness of right knee, not elsewhere classified: Secondary | ICD-10-CM

## 2018-01-03 NOTE — Therapy (Signed)
[facility] [place], [place], [address] Phone: [phone]   Fax:  [phone]  Physical Therapy Treatment  Patient Details  Name: Vincent Obrien MRN: [id number] Date of Birth: 22-Sep-1940 Referring Provider (PT): Frazier Butt MD.   Encounter Date: 01/03/2018  PT End of Session - 01/03/18 1020    Visit Number  3    Number of Visits  12    Date for PT Re-Evaluation  03/27/18    Authorization Type  FOTO AT LEAST EVERY 5TH VISIT, 10TH VISIT PROGRESS NOTE AND KX MODIFIER AFTER THE 15 VISIT.    PT Start Time  [id number]    PT Stop Time  1036    PT Time Calculation (min)  52 min    Activity Tolerance  Patient tolerated treatment well    Behavior During Therapy  WFL for tasks assessed/performed       Past Medical History:  Diagnosis Date  . Cancer [facility])    SKIN CANCER  . GERD (gastroesophageal reflux disease)   . History of skin cancer    left ear  . Hyperlipidemia   . Hypertension   . Sinus congestion     Past Surgical History:  Procedure Laterality Date  . DEBRIDEMENT TENNIS ELBOW     arthroscopic surg left arm  . ROTATOR CUFF REPAIR     right  . SHOULDER SURGERY  2007   torn ligament left shoulder    There were no vitals filed for this visit.  Subjective Assessment - 01/03/18 0950    Subjective  Patient reported doing well thus far with some tightness in knee upon arrival    Pertinent History  Shoulder and elbow surgery.  Cochlear implant.         Sx 12-07-17 RT TKR    How long can you walk comfortably?  Short community distances.    Patient Stated Goals  [name] back to normal.    Currently in Pain?  Yes    Pain Score  4     Pain Location  Knee    Pain Orientation  Right    Pain Descriptors / Indicators  Aching    Pain Type  Surgical pain    Pain Onset  1 to 4 weeks ago    Pain Frequency  Constant    Aggravating Factors   ROM and prolong activity    Pain Relieving Factors  rest         [facility] PT  Assessment - 01/03/18 0001      ROM / Strength   AROM / PROM / Strength  AROM;PROM      AROM   AROM Assessment Site  Knee    Right/Left Knee  Right    Right Knee Extension  0    Right Knee Flexion  115      PROM   PROM Assessment Site  Knee    Right/Left Knee  Right    Right Knee Extension  0    Right Knee Flexion  123                   [facility] Adult PT Treatment/Exercise - 01/03/18 0001      Exercises   Exercises  Knee/Hip      Knee/Hip Exercises: Aerobic   Recumbent Bike  Level 1 at seat4- 3 x 15 minutes.      Knee/Hip Exercises: Standing   Lateral Step Up  10 reps;3 sets;Step Height: 6"    Forward Step  Up  10 reps;Step Height: 6";20 reps;3 sets      Knee/Hip Exercises: Seated   Long Arc Quad  Strengthening;Right;3 sets;10 reps    Long Arc Quad Weight  3 lbs.      Knee/Hip Exercises: Supine   Bridges  Strengthening;20 reps    Straight Leg Raises  Strengthening;Right;20 reps    Other Supine Knee/Hip Exercises  clamshell with redt-band x30      Knee/Hip Exercises: Sidelying   Hip ABduction  Strengthening;Right;20 reps    Clams  x20      Vasopneumatic   Number Minutes Vasopneumatic   15 minutes    Vasopnuematic Location   Knee    Vasopneumatic Pressure  Low      Manual Therapy   Manual Therapy  Passive ROM    Passive ROM  PROM for right knee flexion with low load holds               PT Short Term Goals - 12/27/17 1157      PT SHORT TERM GOAL #1   Title  STG's=LTG's.        PT Long Term Goals - 01/03/18 1010      PT LONG TERM GOAL #1   Title  Independent with a HEP.    Time  6    Period  Weeks    Status  On-going      PT LONG TERM GOAL #2   Title  Active right knee flexion to 125 degrees+ so the patient can perform functional tasks and do so with pain not > 2-3/10.    Time  6    Period  Weeks    Status  On-going      PT LONG TERM GOAL #3   Title  Increase right hip and knee strength to a solid 5/5 to provide good stability  for accomplishment of functional activities.    Time  6    Period  Weeks    Status  On-going      PT LONG TERM GOAL #4   Title  Decrease edema to within 2 cms of non-affected side to assist with pain reduction and range of motion gains.    Time  6    Period  Weeks    Status  On-going      PT LONG TERM GOAL #5   Title  Perform a reciprocating stair gait with one railing with pain not > 2-3/10.    Time  6    Period  Weeks    Status  On-going            Plan - 01/03/18 1021    Clinical Impression Statement  Patient tolerated treatment well today with little discomfort overall. Patient has increased discomfort with stretching too far yet tolerable per reported. Patient has improved with active and passive ROM in right knee today. Patient able to progress exercises with no difficulty. Patient progressing toward goals.  Patient has swelling and weakness deficts.    Rehab Potential  Excellent    PT Frequency  2x / week    PT Duration  6 weeks    PT Treatment/Interventions  ADLs/Self Care Home Management;Cryotherapy;Glass blower/designer;Therapeutic activities;Therapeutic exercise;Patient/family education;Neuromuscular re-education;Manual techniques;Passive range of motion;Vasopneumatic Device    PT Next Visit Plan  cont with POC for Stationary bike and strengthening progression.  Vasopneumatic and electrical stimulation.    Consulted and Agree with Plan of Care  Patient       Patient will benefit  from skilled therapeutic intervention in order to improve the following deficits and impairments:  Pain, Decreased activity tolerance, Decreased range of motion, Decreased strength, Increased edema  Visit Diagnosis: Chronic pain of right knee  Stiffness of right knee, not elsewhere classified  Localized edema     Problem List There are no active problems to display for this patient.   Ladean Raya, PTA 01/03/18 10:38 AM  [facility] [place], [place], [address] Phone: [phone]   Fax:  [phone]  Name: Vincent Obrien MRN: [id number] Date of Birth: September 04, 1940

## 2018-01-07 ENCOUNTER — Ambulatory Visit: Payer: Medicare Other | Admitting: *Deleted

## 2018-01-07 DIAGNOSIS — M25561 Pain in right knee: Secondary | ICD-10-CM | POA: Diagnosis not present

## 2018-01-07 DIAGNOSIS — M25661 Stiffness of right knee, not elsewhere classified: Secondary | ICD-10-CM

## 2018-01-07 DIAGNOSIS — R600 Localized edema: Secondary | ICD-10-CM

## 2018-01-07 DIAGNOSIS — G8929 Other chronic pain: Secondary | ICD-10-CM

## 2018-01-07 NOTE — Therapy (Signed)
[facility] [place], [place], [address] Phone: [phone]   Fax:  [phone]  Physical Therapy Treatment  Patient Details  Name: Vincent Obrien MRN: [id number] Date of Birth: 02-Oct-1940 Referring Provider (PT): Frazier Butt MD.   Encounter Date: 01/07/2018  PT End of Session - 01/07/18 1135    Visit Number  4    Number of Visits  12    Date for PT Re-Evaluation  03/27/18    Authorization Type  FOTO AT LEAST EVERY 5TH VISIT, 10TH VISIT PROGRESS NOTE AND KX MODIFIER AFTER THE 15 VISIT.    PT Start Time  1030    PT Stop Time  1125    PT Time Calculation (min)  55 min       Past Medical History:  Diagnosis Date  . Cancer [facility])    SKIN CANCER  . GERD (gastroesophageal reflux disease)   . History of skin cancer    left ear  . Hyperlipidemia   . Hypertension   . Sinus congestion     Past Surgical History:  Procedure Laterality Date  . DEBRIDEMENT TENNIS ELBOW     arthroscopic surg left arm  . ROTATOR CUFF REPAIR     right  . SHOULDER SURGERY  2007   torn ligament left shoulder    There were no vitals filed for this visit.  Subjective Assessment - 01/07/18 1044    Pertinent History  Shoulder and elbow surgery.  Cochlear implant.         Sx 12-07-17 RT TKR    How long can you walk comfortably?  Short community distances.    Patient Stated Goals  Gert back to normal.    Pain Location  Knee    Pain Orientation  Right    Pain Descriptors / Indicators  Aching;Sore    Pain Type  Surgical pain    Pain Onset  1 to 4 weeks ago    Pain Frequency  Intermittent                       [facility] Adult PT Treatment/Exercise - 01/07/18 0001      Exercises   Exercises  Knee/Hip      Knee/Hip Exercises: Aerobic   Recumbent Bike  Level 1 at seat4- 3 x 15 minutes.      Knee/Hip Exercises: Machines for Strengthening   Cybex Knee Extension  10#s 3x10    Cybex Knee Flexion  30# 3x10      Knee/Hip  Exercises: Standing   Lateral Step Up  10 reps;3 sets;Step Height: 6"    Forward Step Up  10 reps;Step Height: 6";20 reps;3 sets      Knee/Hip Exercises: Supine   Straight Leg Raises  Strengthening;Right;10 reps      Knee/Hip Exercises: Sidelying   Hip ABduction  Right;3 sets;15 reps    Clams  3x10-15      Modalities   Modalities  Vasopneumatic      Vasopneumatic   Number Minutes Vasopneumatic   15 minutes    Vasopnuematic Location   Knee    Vasopneumatic Pressure  Low    Vasopneumatic Temperature   36      Manual Therapy   Manual Therapy  Passive ROM    Passive ROM  PROM for flexion 0-120 degrees today               PT Short Term Goals - 12/27/17 1157  PT SHORT TERM GOAL #1   Title  STG's=LTG's.        PT Long Term Goals - 01/03/18 1010      PT LONG TERM GOAL #1   Title  Independent with a HEP.    Time  6    Period  Weeks    Status  On-going      PT LONG TERM GOAL #2   Title  Active right knee flexion to 125 degrees+ so the patient can perform functional tasks and do so with pain not > 2-3/10.    Time  6    Period  Weeks    Status  On-going      PT LONG TERM GOAL #3   Title  Increase right hip and knee strength to a solid 5/5 to provide good stability for accomplishment of functional activities.    Time  6    Period  Weeks    Status  On-going      PT LONG TERM GOAL #4   Title  Decrease edema to within 2 cms of non-affected side to assist with pain reduction and range of motion gains.    Time  6    Period  Weeks    Status  On-going      PT LONG TERM GOAL #5   Title  Perform a reciprocating stair gait with one railing with pain not > 2-3/10.    Time  6    Period  Weeks    Status  On-going            Plan - 01/07/18 1136    Clinical Impression Statement  Pt arrived to clinic today doing fairly well with RT knee. He continues to progress with strengthening exs and ROM. No complaints with knee extension machine or HS curls and ROM  0-120 degrees today. Incision appears to be  healing nicely and is mobile. Mild swelling still noted around patella, but progressing well overall. Normal modality response to Vaso.    Clinical Presentation  Stable    Rehab Potential  Excellent    PT Frequency  2x / week    PT Duration  6 weeks    PT Treatment/Interventions  ADLs/Self Care Home Management;Cryotherapy;Glass blower/designer;Therapeutic activities;Therapeutic exercise;Patient/family education;Neuromuscular re-education;Manual techniques;Passive range of motion;Vasopneumatic Device    PT Next Visit Plan  cont with POC for Stationary bike and strengthening progression.  Vasopneumatic and electrical stimulation.    Consulted and Agree with Plan of Care  Patient       Patient will benefit from skilled therapeutic intervention in order to improve the following deficits and impairments:  Pain, Decreased activity tolerance, Decreased range of motion, Decreased strength, Increased edema  Visit Diagnosis: Chronic pain of right knee  Stiffness of right knee, not elsewhere classified  Localized edema     Problem List There are no active problems to display for this patient.   RAMSEUR,CHRIS, PTA 01/07/2018, 11:50 AM  [facility] [address] [place], [place], [address] Phone: [phone]   Fax:  [phone]  Name: Vincent Obrien MRN: [id number] Date of Birth: 02/07/1941

## 2018-01-11 ENCOUNTER — Ambulatory Visit: Payer: Medicare Other | Admitting: *Deleted

## 2018-01-11 DIAGNOSIS — R600 Localized edema: Secondary | ICD-10-CM

## 2018-01-11 DIAGNOSIS — G8929 Other chronic pain: Secondary | ICD-10-CM

## 2018-01-11 DIAGNOSIS — M25561 Pain in right knee: Principal | ICD-10-CM

## 2018-01-11 DIAGNOSIS — M25661 Stiffness of right knee, not elsewhere classified: Secondary | ICD-10-CM

## 2018-01-11 NOTE — Therapy (Signed)
[facility] [place], [place], [address] Phone: [phone]   Fax:  [phone]  Physical Therapy Treatment  Patient Details  Name: Vincent Obrien MRN: [id number] Date of Birth: Apr 22, 1940 Referring Provider (PT): Frazier Butt MD.   Encounter Date: 01/11/2018  PT End of Session - 01/11/18 1610    Visit Number  5    Number of Visits  12    Date for PT Re-Evaluation  03/27/18    Authorization Type  FOTO AT LEAST EVERY 5TH VISIT, 10TH VISIT PROGRESS NOTE AND KX MODIFIER AFTER THE 15 VISIT.          5th visit FOTO     PT Start Time  1600    PT Stop Time  1651    PT Time Calculation (min)  51 min       Past Medical History:  Diagnosis Date  . Cancer [facility])    SKIN CANCER  . GERD (gastroesophageal reflux disease)   . History of skin cancer    left ear  . Hyperlipidemia   . Hypertension   . Sinus congestion     Past Surgical History:  Procedure Laterality Date  . DEBRIDEMENT TENNIS ELBOW     arthroscopic surg left arm  . ROTATOR CUFF REPAIR     right  . SHOULDER SURGERY  2007   torn ligament left shoulder    There were no vitals filed for this visit.  Subjective Assessment - 01/11/18 1609    Subjective  RT knee doin good. Mainly soreness    Pertinent History  Shoulder and elbow surgery.  Cochlear implant.         Sx 12-07-17 RT TKR    How long can you walk comfortably?  Short community distances.    Patient Stated Goals  [name] back to normal.    Currently in Pain?  Yes    Pain Score  2     Pain Location  Knee    Pain Orientation  Right    Pain Descriptors / Indicators  Sore    Pain Type  Surgical pain    Pain Onset  1 to 4 weeks ago         [facility] PT Assessment - 01/11/18 0001      Circumferential Edema   Circumferential - Right  RT 39cm    LT 37.5cm                   [facility] Adult PT Treatment/Exercise - 01/11/18 0001      Exercises   Exercises  Knee/Hip      Knee/Hip Exercises:  Aerobic   Recumbent Bike  Level 1 at seat4- 3 x 15 minutes.      Knee/Hip Exercises: Machines for Strengthening   Cybex Knee Extension  10#s 3x15    Cybex Knee Flexion  30# 3x15      Knee/Hip Exercises: Standing   Lateral Step Up  10 reps;3 sets;Step Height: 6"    Forward Step Up  10 reps;Step Height: 6";20 reps;3 sets    Rocker Board  3 minutes   calf stretching   Other Standing Knee Exercises  side step overs 3 bouts eachway      Modalities   Modalities  Vasopneumatic      Vasopneumatic   Number Minutes Vasopneumatic   15 minutes    Vasopnuematic Location   Knee    Vasopneumatic Pressure  Low    Vasopneumatic Temperature   36  Manual Therapy   Manual Therapy  Passive ROM    Passive ROM  PROM for flexion 0-120 degrees today               PT Short Term Goals - 12/27/17 1157      PT SHORT TERM GOAL #1   Title  STG's=LTG's.        PT Long Term Goals - 01/03/18 1010      PT LONG TERM GOAL #1   Title  Independent with a HEP.    Time  6    Period  Weeks    Status  On-going      PT LONG TERM GOAL #2   Title  Active right knee flexion to 125 degrees+ so the patient can perform functional tasks and do so with pain not > 2-3/10.    Time  6    Period  Weeks    Status  On-going      PT LONG TERM GOAL #3   Title  Increase right hip and knee strength to a solid 5/5 to provide good stability for accomplishment of functional activities.    Time  6    Period  Weeks    Status  On-going      PT LONG TERM GOAL #4   Title  Decrease edema to within 2 cms of non-affected side to assist with pain reduction and range of motion gains.    Time  6    Period  Weeks    Status  On-going      PT LONG TERM GOAL #5   Title  Perform a reciprocating stair gait with one railing with pain not > 2-3/10.    Time  6    Period  Weeks    Status  On-going            Plan - 01/11/18 1655    Clinical Impression Statement  Pt arrived today doing well with minimal RT knee  pain. He was able to perform all therex with progression of reps with no complaints. ROM 0-120 degreestoday with normal modality responses.    Clinical Presentation  Stable    Rehab Potential  Excellent    PT Frequency  2x / week    PT Duration  6 weeks    PT Treatment/Interventions  ADLs/Self Care Home Management;Cryotherapy;Glass blower/designer;Therapeutic activities;Therapeutic exercise;Patient/family education;Neuromuscular re-education;Manual techniques;Passive range of motion;Vasopneumatic Device    PT Next Visit Plan  cont with POC for Stationary bike and strengthening progression.  Vasopneumatic and electrical stimulation.    Consulted and Agree with Plan of Care  Patient       Patient will benefit from skilled therapeutic intervention in order to improve the following deficits and impairments:  Pain, Decreased activity tolerance, Decreased range of motion, Decreased strength, Increased edema  Visit Diagnosis: Chronic pain of right knee  Stiffness of right knee, not elsewhere classified  Localized edema     Problem List There are no active problems to display for this patient.   ,CHRIS,PTA 01/11/2018, 6:08 PM  [facility] [place], [place], [address] Phone: [phone]   Fax:  [phone]  Name: Vincent Obrien MRN: [id number] Date of Birth: May 28, 1940

## 2018-01-12 ENCOUNTER — Ambulatory Visit: Payer: Medicare Other | Admitting: Physical Therapy

## 2018-01-12 ENCOUNTER — Encounter: Payer: Self-pay | Admitting: Physical Therapy

## 2018-01-12 DIAGNOSIS — M25561 Pain in right knee: Secondary | ICD-10-CM | POA: Diagnosis not present

## 2018-01-12 DIAGNOSIS — M25661 Stiffness of right knee, not elsewhere classified: Secondary | ICD-10-CM

## 2018-01-12 DIAGNOSIS — R600 Localized edema: Secondary | ICD-10-CM

## 2018-01-12 DIAGNOSIS — G8929 Other chronic pain: Secondary | ICD-10-CM

## 2018-01-12 NOTE — Therapy (Signed)
[facility] [place], [place], [address] Phone: [phone]   Fax:  [phone]  Physical Therapy Treatment  Patient Details  Name: Vincent Obrien MRN: [id number] Date of Birth: 01-19-1941 Referring Provider (PT): Vincent Butt MD.   Encounter Date: 01/12/2018  PT End of Session - 01/12/18 1442    Visit Number  6    Number of Visits  12    Date for PT Re-Evaluation  03/27/18    Authorization Type  FOTO AT LEAST EVERY 5TH VISIT, 10TH VISIT PROGRESS NOTE AND KX MODIFIER AFTER THE 15 VISIT.          5th visit FOTO     PT Start Time  1423    PT Stop Time  1513    PT Time Calculation (min)  50 min    Activity Tolerance  Patient tolerated treatment well    Behavior During Therapy  WFL for tasks assessed/performed       Past Medical History:  Diagnosis Date  . Cancer [facility])    SKIN CANCER  . GERD (gastroesophageal reflux disease)   . History of skin cancer    left ear  . Hyperlipidemia   . Hypertension   . Sinus congestion     Past Surgical History:  Procedure Laterality Date  . DEBRIDEMENT TENNIS ELBOW     arthroscopic surg left arm  . ROTATOR CUFF REPAIR     right  . SHOULDER SURGERY  2007   torn ligament left shoulder    There were no vitals filed for this visit.  Subjective Assessment - 01/12/18 1425    Subjective  Patient arrived doing well with some ongoing soreness reported    Pertinent History  Shoulder and elbow surgery.  Cochlear implant.         Sx 12-07-17 RT TKR    How long can you walk comfortably?  Short community distances.    Patient Stated Goals  Vincent Obrien back to normal.    Currently in Pain?  Yes    Pain Score  2     Pain Location  Knee    Pain Orientation  Right    Pain Descriptors / Indicators  Sore    Pain Type  Surgical pain    Pain Onset  1 to 4 weeks ago    Pain Frequency  Intermittent    Aggravating Factors   ROM    Pain Relieving Factors  Rest         [facility] PT Assessment -  01/12/18 0001      Circumferential Edema   Circumferential - Right  RT 38.5cm    LT 37.5cm   1cm     AROM   AROM Assessment Site  Knee    Right/Left Knee  Right    Right Knee Extension  0    Right Knee Flexion  120      PROM   PROM Assessment Site  Knee    Right/Left Knee  Right    Right Knee Extension  0    Right Knee Flexion  125                   [facility] Adult PT Treatment/Exercise - 01/12/18 0001      Exercises   Exercises  Knee/Hip      Knee/Hip Exercises: Aerobic   Recumbent Bike  Level 1 at seat4- 3 x 15 minutes.      Knee/Hip Exercises: Machines for Strengthening   Cybex  Knee Extension  10# 3x15    Cybex Knee Flexion  30# 3x15      Knee/Hip Exercises: Standing   Terminal Knee Extension  Strengthening;Right;20 reps;10 reps;Theraband    Theraband Level (Terminal Knee Extension)  Level 3 (Green)    Lateral Step Up  10 reps;3 sets;Step Height: 6"    Forward Step Up  10 reps;Step Height: 6";20 reps;3 sets    Rocker Board  3 minutes      Knee/Hip Exercises: Supine   Other Supine Knee/Hip Exercises  clamshell with green t-band x30      Vasopneumatic   Number Minutes Vasopneumatic   15 minutes    Vasopnuematic Location   Knee    Vasopneumatic Pressure  Low               PT Short Term Goals - 12/27/17 1157      PT SHORT TERM GOAL #1   Title  STG's=LTG's.        PT Long Term Goals - 01/12/18 1441      PT LONG TERM GOAL #1   Title  Independent with a HEP.    Time  6    Period  Weeks    Status  On-going      PT LONG TERM GOAL #2   Title  Active right knee flexion to 125 degrees+ so the patient can perform functional tasks and do so with pain not > 2-3/10.    Time  6    Period  Weeks    Status  On-going   AROM 120 degrees 01/12/18     PT LONG TERM GOAL #3   Title  Increase right hip and knee strength to a solid 5/5 to provide good stability for accomplishment of functional activities.    Time  6    Period  Weeks    Status   On-going      PT LONG TERM GOAL #4   Title  Decrease edema to within 2 cms of non-affected side to assist with pain reduction and range of motion gains.    Time  6    Period  Weeks    Status  Achieved   1cm 01/12/18     PT LONG TERM GOAL #5   Title  Perform a reciprocating stair gait with one railing with pain not > 2-3/10.    Time  6    Period  Weeks    Status  On-going            Plan - 01/12/18 1458    Clinical Impression Statement  Patient tolerated treatment well today. Patient able to progress with exercises today to strengthen right knee with no increased discomfort or difficulty. Patient progressing with ROM today and decreased edema. Patient met goal today, others ongoing.     Rehab Potential  Excellent    PT Frequency  2x / week    PT Duration  6 weeks    PT Treatment/Interventions  ADLs/Self Care Home Management;Cryotherapy;Glass blower/designer;Therapeutic activities;Therapeutic exercise;Patient/family education;Neuromuscular re-education;Manual techniques;Passive range of motion;Vasopneumatic Device    PT Next Visit Plan  cont with POC for strengthening progression.  Vasopneumatic and electrical stimulation.    Consulted and Agree with Plan of Care  Patient       Patient will benefit from skilled therapeutic intervention in order to improve the following deficits and impairments:  Pain, Decreased activity tolerance, Decreased range of motion, Decreased strength, Increased edema  Visit Diagnosis: Chronic pain of right knee  Stiffness of right knee, not elsewhere classified  Localized edema     Problem List There are no active problems to display for this patient.   Vincent Obrien, PTA 01/12/2018, 3:13 PM  [facility] [address] [place], [place], [address] Phone: [phone]   Fax:  [phone]  Name: Vincent Obrien MRN: [id number] Date of Birth: April 22, 1940

## 2018-01-18 ENCOUNTER — Ambulatory Visit: Payer: Medicare Other | Attending: Orthopaedic Surgery | Admitting: Physical Therapy

## 2018-01-18 ENCOUNTER — Encounter: Payer: Self-pay | Admitting: Physical Therapy

## 2018-01-18 DIAGNOSIS — M25661 Stiffness of right knee, not elsewhere classified: Secondary | ICD-10-CM | POA: Diagnosis present

## 2018-01-18 DIAGNOSIS — M25561 Pain in right knee: Secondary | ICD-10-CM | POA: Insufficient documentation

## 2018-01-18 DIAGNOSIS — R600 Localized edema: Secondary | ICD-10-CM | POA: Insufficient documentation

## 2018-01-18 DIAGNOSIS — G8929 Other chronic pain: Secondary | ICD-10-CM | POA: Insufficient documentation

## 2018-01-18 NOTE — Therapy (Signed)
[facility] [place], [place], [address] Phone: [phone]   Fax:  [phone]  Physical Therapy Treatment  Patient Details  Name: Vincent Obrien MRN: [id number] Date of Birth: Jan 27, 1941 Referring Provider (PT): Frazier Butt MD.   Encounter Date: 01/18/2018  PT End of Session - 01/18/18 1423    Visit Number  7    Number of Visits  12    Date for PT Re-Evaluation  03/27/18    Authorization Type  FOTO AT LEAST EVERY 5TH VISIT, 10TH VISIT PROGRESS NOTE AND KX MODIFIER AFTER THE 15 VISIT.          5th visit FOTO     PT Start Time  1433    PT Stop Time  1516    PT Time Calculation (min)  43 min    Activity Tolerance  Patient tolerated treatment well    Behavior During Therapy  WFL for tasks assessed/performed       Past Medical History:  Diagnosis Date  . Cancer [facility])    SKIN CANCER  . GERD (gastroesophageal reflux disease)   . History of skin cancer    left ear  . Hyperlipidemia   . Hypertension   . Sinus congestion     Past Surgical History:  Procedure Laterality Date  . DEBRIDEMENT TENNIS ELBOW     arthroscopic surg left arm  . ROTATOR CUFF REPAIR     right  . SHOULDER SURGERY  2007   torn ligament left shoulder    There were no vitals filed for this visit.  Subjective Assessment - 01/18/18 1423    Subjective  Requests to D/C as MD said he was doing well and he could continue at the [facility] [employer].    Pertinent History  Shoulder and elbow surgery.  Cochlear implant.         Sx 12-07-17 RT TKR    How long can you walk comfortably?  Short community distances.    Patient Stated Goals  Vincent Obrien back to normal.    Currently in Pain?  No/denies         [facility] PT Assessment - 01/18/18 0001      Assessment   Medical Diagnosis  Right total knee replacement.    Referring Provider (PT)  Frazier Butt MD.    Onset Date/Surgical Date  12/07/17    Next MD Visit  02/28/2018      Restrictions   Weight  Bearing Restrictions  No      ROM / Strength   AROM / PROM / Strength  AROM;Strength      AROM   Overall AROM   Within functional limits for tasks performed    AROM Assessment Site  Knee    Right/Left Knee  Right    Right Knee Extension  0    Right Knee Flexion  128      Strength   Overall Strength  Within functional limits for tasks performed    Strength Assessment Site  Hip;Knee    Right/Left Hip  Right    Right Hip Flexion  5/5    Right Hip ABduction  5/5    Right/Left Knee  Right    Right Knee Flexion  4+/5    Right Knee Extension  5/5                   [facility] Adult PT Treatment/Exercise - 01/18/18 0001      Ambulation/Gait   Stairs  Yes  Stairs Assistance  7: Independent    Stair Management Technique  One rail Left;Alternating pattern;Forwards    Number of Stairs  4   x2 RT   Height of Stairs  6.5      Exercises   Exercises  Knee/Hip      Knee/Hip Exercises: Aerobic   Recumbent Bike  L3, seat 3 x10 min      Knee/Hip Exercises: Machines for Strengthening   Cybex Knee Extension  20# 3x10 reps    Cybex Knee Flexion  40# 3x10 reps    Cybex Leg Press  3 pl, seat 6 x30 reps      Modalities   Modalities  Vasopneumatic      Vasopneumatic   Number Minutes Vasopneumatic   15 minutes    Vasopnuematic Location   Knee    Vasopneumatic Pressure  Low    Vasopneumatic Temperature   34               PT Short Term Goals - 12/27/17 1157      PT SHORT TERM GOAL #1   Title  STG's=LTG's.        PT Long Term Goals - 01/18/18 1443      PT LONG TERM GOAL #1   Title  Independent with a HEP.    Time  6    Period  Weeks    Status  Unable to assess      PT LONG TERM GOAL #2   Title  Active right knee flexion to 125 degrees+ so the patient can perform functional tasks and do so with pain not > 2-3/10.    Time  6    Period  Weeks    Status  Achieved   AROM R knee 128 deg 01/18/2018     PT LONG TERM GOAL #3   Title  Increase right hip and knee  strength to a solid 5/5 to provide good stability for accomplishment of functional activities.    Time  6    Period  Weeks    Status  Partially Met   RLE MMT 4+/5-5/5 01/18/2018     PT LONG TERM GOAL #4   Title  Decrease edema to within 2 cms of non-affected side to assist with pain reduction and range of motion gains.    Time  6    Period  Weeks    Status  Achieved   1cm 01/12/18     PT LONG TERM GOAL #5   Title  Perform a reciprocating stair gait with one railing with pain not > 2-3/10.    Time  6    Period  Weeks    Status  Achieved            Plan - 01/18/18 1501    Clinical Impression Statement  Patient progressed well following R TKR. Patient cleared by surgeon to D/C from PT and resume gym activities. Patient denies any worries at home only wishing to return to golf in the spring. Patient able to ambulate stairs and around clinic well with no deviations noted. RLE MMT assessed as 4+/5-5/5 throughout and AROM R knee measured as 0-128 deg. Well healed incision observed with no scabbing or abnormal discoloration. Patient educated to ice as needed and elevate as needed too for edema management. Patient very familiar with gym set up and equipment.    Rehab Potential  Excellent    PT Frequency  2x / week    PT Duration  6 weeks  PT Treatment/Interventions  ADLs/Self Care Home Management;Cryotherapy;Glass blower/designer;Therapeutic activities;Therapeutic exercise;Patient/family education;Neuromuscular re-education;Manual techniques;Passive range of motion;Vasopneumatic Device    PT Next Visit Plan  D/C summary required.    Consulted and Agree with Plan of Care  Patient       Patient will benefit from skilled therapeutic intervention in order to improve the following deficits and impairments:  Pain, Decreased activity tolerance, Decreased range of motion, Decreased strength, Increased edema  Visit Diagnosis: Chronic pain of right knee  Stiffness of right  knee, not elsewhere classified  Localized edema     Problem List There are no active problems to display for this patient.   Standley Brooking, PTA 01/18/18 3:19 PM   [facility] [place], [place], [address] Phone: [phone]   Fax:  [phone]  Name: Vincent Obrien MRN: [id number] Date of Birth: 30-Nov-1940  PHYSICAL THERAPY DISCHARGE SUMMARY  Visits from Start of Care: 7.  Current functional level related to goals / functional outcomes: See above.   Remaining deficits: See goal section.    Education / Equipment: HEP. Plan: Patient agrees to discharge.  Patient goals were partially met. Patient is being discharged due to the physician's request.  ?????        [name] Applegate MPT

## 2018-01-20 ENCOUNTER — Encounter: Payer: Medicare Other | Admitting: Physical Therapy

## 2018-02-23 ENCOUNTER — Ambulatory Visit (INDEPENDENT_AMBULATORY_CARE_PROVIDER_SITE_OTHER): Payer: Medicare Other | Admitting: Urology

## 2018-02-23 DIAGNOSIS — R310 Gross hematuria: Secondary | ICD-10-CM

## 2018-02-23 DIAGNOSIS — C61 Malignant neoplasm of prostate: Secondary | ICD-10-CM

## 2018-02-23 DIAGNOSIS — N401 Enlarged prostate with lower urinary tract symptoms: Secondary | ICD-10-CM

## 2018-02-23 DIAGNOSIS — N400 Enlarged prostate without lower urinary tract symptoms: Secondary | ICD-10-CM | POA: Diagnosis not present

## 2018-10-05 ENCOUNTER — Ambulatory Visit (INDEPENDENT_AMBULATORY_CARE_PROVIDER_SITE_OTHER): Payer: Medicare Other | Admitting: Urology

## 2018-10-05 DIAGNOSIS — C61 Malignant neoplasm of prostate: Secondary | ICD-10-CM

## 2018-10-05 DIAGNOSIS — N400 Enlarged prostate without lower urinary tract symptoms: Secondary | ICD-10-CM | POA: Diagnosis not present

## 2018-10-05 DIAGNOSIS — N401 Enlarged prostate with lower urinary tract symptoms: Secondary | ICD-10-CM

## 2019-02-06 ENCOUNTER — Other Ambulatory Visit: Payer: Self-pay

## 2019-02-06 DIAGNOSIS — C61 Malignant neoplasm of prostate: Secondary | ICD-10-CM

## 2019-04-03 ENCOUNTER — Other Ambulatory Visit: Payer: Self-pay | Admitting: Urology

## 2019-04-04 LAB — PSA: PSA: 0.4 ng/mL (ref <=4.0)

## 2019-04-05 ENCOUNTER — Encounter: Payer: Self-pay | Admitting: Urology

## 2019-04-05 ENCOUNTER — Ambulatory Visit (INDEPENDENT_AMBULATORY_CARE_PROVIDER_SITE_OTHER): Payer: Medicare Other | Admitting: Urology

## 2019-04-05 ENCOUNTER — Other Ambulatory Visit: Payer: Self-pay

## 2019-04-05 VITALS — BP 174/80 | HR 75 | Temp 97.3°F | Ht 66.0 in | Wt 152.0 lb

## 2019-04-05 DIAGNOSIS — N5201 Erectile dysfunction due to arterial insufficiency: Secondary | ICD-10-CM | POA: Diagnosis not present

## 2019-04-05 DIAGNOSIS — R351 Nocturia: Secondary | ICD-10-CM

## 2019-04-05 DIAGNOSIS — C61 Malignant neoplasm of prostate: Secondary | ICD-10-CM | POA: Diagnosis not present

## 2019-04-05 DIAGNOSIS — N401 Enlarged prostate with lower urinary tract symptoms: Secondary | ICD-10-CM | POA: Diagnosis not present

## 2019-04-05 DIAGNOSIS — N138 Other obstructive and reflux uropathy: Secondary | ICD-10-CM

## 2019-04-05 MED ORDER — TADALAFIL 20 MG PO TABS: 20.0000 mg | ORAL_TABLET | Freq: Every day | ORAL | 5 refills | Status: DC | PRN

## 2019-04-05 MED ORDER — TAMSULOSIN HCL 0.4 MG PO CAPS: 0.4000 mg | ORAL_CAPSULE | Freq: Every day | ORAL | 3 refills | Status: DC

## 2019-04-05 NOTE — Patient Instructions (Signed)

## 2019-04-05 NOTE — Progress Notes (Signed)
04/05/2019 1:58 PM   Vincent Obrien 1940-05-26 [id number]  Referring provider: Dione Housekeeper, MD [place],  [place] [address]  Prostate Cancer and nocturia, ED  HPI: Mr Champion is a 79yo here for followup for prostate cancer and BPH with nocturia. He has stable BPH with nocturia and is currently on flomax 0.4mg  QD. Nocturia 1-2x. No other significant LUTS. Prostate cancer is stable and most recent PSA is 0.4 up from 0.3 6 months ago. Last visit he was given rx for sildenafil for ED which failed to improve his ability to get and maintain an erection. Good libido  His records from [facility] are as follows: 02/26/2016: Biopsy revealed Gleason 4+3=7 in 1/12 cores, Gleason 3+4=7 in 1/12 cores and Gleason 3+3=6 in 1/12 cores on the right base   03/31/2016: Pt saw Dr. Tammi Klippel and the patient has decided on IMRT with 6 months of hormones   05/13/2016: pt here for gold seed placement   08/05/2016: He finished his IMRT yesterday. He is on flomax for LUTS. He is taking miralax for constipation. No hematuria or blood in stool. No dysuria   11/18/2016: PSA undetectable. Pt finished 6 months ADT. No LUTS. He has occasional hot flashes   03/03/2017: PSA increased to 0.1 from undetectable.   06/09/2017: PSA is 0.1 which is stable   09/08/2017: psa is 0.2. no new luts.   10/04/2018: PSA stable at 0.3     PMH: Past Medical History:  Diagnosis Date  . Cancer [facility])    SKIN CANCER  . GERD (gastroesophageal reflux disease)   . History of skin cancer    left ear  . Hyperlipidemia   . Hypertension   . Sinus congestion     Surgical History: Past Surgical History:  Procedure Laterality Date  . DEBRIDEMENT TENNIS ELBOW     arthroscopic surg left arm  . ROTATOR CUFF REPAIR     right  . SHOULDER SURGERY  2007   torn ligament left shoulder    Home Medications:  Allergies as of 04/05/2019   No Known Allergies     Medication List       Accurate as of April 05, 2019  1:58 PM. If you  have any questions, ask your nurse or doctor.        aspirin 81 MG tablet Take 81 mg by mouth daily.   dutasteride 0.5 MG capsule Commonly known as: AVODART Take 0.5 mg by mouth daily.   LIPITOR PO Take by mouth.   omeprazole 20 MG capsule Commonly known as: PRILOSEC Take 20 mg by mouth daily.       Allergies: No Known Allergies  Family History: Family History  Problem Relation Age of Onset  . Breast cancer Mother   . Colon cancer Neg Hx     Social History:  reports that he has never smoked. He has never used smokeless tobacco. He reports that he does not drink alcohol or use drugs.  ROS: All other review of systems were reviewed and are negative except what is noted above in HPI  Physical Exam: BP (!) 174/80   Pulse 75   Temp (!) 97.3 F (36.3 C)   Ht 5\' 6"  (1.676 m)   Wt 152 lb (68.9 kg)   BMI 24.53 kg/m   Constitutional:  Alert and oriented, No acute distress. HEENT: [place] AT, moist mucus membranes.  Trachea midline, no masses. Cardiovascular: No clubbing, cyanosis, or edema. Respiratory: Normal respiratory effort, no increased work of breathing.  GI: Abdomen is soft, nontender, nondistended, no abdominal masses GU: No CVA tenderness Lymph: No cervical or inguinal lymphadenopathy. Skin: No rashes, bruises or suspicious lesions. Neurologic: Grossly intact, no focal deficits, moving all 4 extremities. Psychiatric: Normal mood and affect.  Laboratory Data: No results found for: WBC, HGB, HCT, MCV, PLT  Lab Results  Component Value Date   CREATININE 1.40 (H) 09/07/2017    Lab Results  Component Value Date   PSA 0.4 04/03/2019    No results found for: TESTOSTERONE  No results found for: HGBA1C  Urinalysis No results found for: COLORURINE, APPEARANCEUR, LABSPEC, PHURINE, GLUCOSEU, HGBUR, BILIRUBINUR, KETONESUR, PROTEINUR, UROBILINOGEN, NITRITE, LEUKOCYTESUR  No results found for: LABMICR, [place], RBCUA, LABEPIT, MUCUS, BACTERIA  Pertinent  Imaging:  No results found for this or any previous visit. No results found for this or any previous visit. No results found for this or any previous visit. No results found for this or any previous visit. No results found for this or any previous visit. No results found for this or any previous visit. No results found for this or any previous visit. No results found for this or any previous visit.  Assessment & Plan:    1. Prostate cancer ([place]) -stable -RTC 6 months with PSA and DRE  2. BPH with LUTS, Nocturia -continue flomax 0.4mg  QD  3. ED: -tadalafil 20mg  prn   No follow-ups on file.  Nicolette Bang, MD  [facility] [place]

## 2019-04-05 NOTE — Progress Notes (Signed)
Urological Symptom Review  Patient is experiencing the following symptoms:  None  Review of Systems  Gastrointestinal (upper)  : Negative for upper GI symptoms  Gastrointestinal (lower) : Negative for lower GI symptoms  Constitutional : Negative for symptoms  Skin: Negative for skin symptoms  Eyes: Negative for eye symptoms  Ear/Nose/Throat : Negative for Ear/Nose/Throat symptoms  Hematologic/Lymphatic: Negative for Hematologic/Lymphatic symptoms  Cardiovascular : None  Respiratory : Negative for respiratory symptoms  Endocrine: Negative for endocrine symptoms  Musculoskeletal: None  Neurological: Negative for neurological symptoms  Psychologic: Negative for psychiatric symptoms

## 2019-09-28 ENCOUNTER — Encounter: Payer: Self-pay | Admitting: Urology

## 2019-10-04 ENCOUNTER — Ambulatory Visit: Payer: PRIVATE HEALTH INSURANCE | Admitting: Urology

## 2019-10-12 ENCOUNTER — Other Ambulatory Visit: Payer: PRIVATE HEALTH INSURANCE

## 2019-10-12 ENCOUNTER — Other Ambulatory Visit: Payer: Self-pay

## 2019-10-12 DIAGNOSIS — C61 Malignant neoplasm of prostate: Secondary | ICD-10-CM

## 2019-10-13 LAB — PSA: Prostate Specific Ag, Serum: 0.7 ng/mL (ref 0.0–4.0)

## 2019-10-24 NOTE — Progress Notes (Signed)
Results sent via my chart 

## 2019-11-01 ENCOUNTER — Ambulatory Visit: Payer: PRIVATE HEALTH INSURANCE | Admitting: Urology

## 2019-11-15 ENCOUNTER — Encounter: Payer: Self-pay | Admitting: Urology

## 2019-11-15 ENCOUNTER — Other Ambulatory Visit: Payer: Self-pay

## 2019-11-15 ENCOUNTER — Ambulatory Visit (INDEPENDENT_AMBULATORY_CARE_PROVIDER_SITE_OTHER): Payer: Medicare Other | Admitting: Urology

## 2019-11-15 VITALS — BP 189/80 | HR 65 | Temp 97.8°F | Ht 66.0 in | Wt 152.0 lb

## 2019-11-15 DIAGNOSIS — N401 Enlarged prostate with lower urinary tract symptoms: Secondary | ICD-10-CM | POA: Diagnosis not present

## 2019-11-15 DIAGNOSIS — N138 Other obstructive and reflux uropathy: Secondary | ICD-10-CM

## 2019-11-15 DIAGNOSIS — R351 Nocturia: Secondary | ICD-10-CM

## 2019-11-15 DIAGNOSIS — N5201 Erectile dysfunction due to arterial insufficiency: Secondary | ICD-10-CM

## 2019-11-15 DIAGNOSIS — C61 Malignant neoplasm of prostate: Secondary | ICD-10-CM

## 2019-11-15 LAB — URINALYSIS, ROUTINE W REFLEX MICROSCOPIC
Bilirubin, UA: NEGATIVE
Glucose, UA: NEGATIVE
Ketones, UA: NEGATIVE
Leukocytes,UA: NEGATIVE
Nitrite, UA: NEGATIVE
Specific Gravity, UA: 1.02 (ref 1.005–1.030)
Urobilinogen, Ur: 0.2 mg/dL (ref 0.2–1.0)
pH, UA: 6 (ref 5.0–7.5)

## 2019-11-15 LAB — MICROSCOPIC EXAMINATION
Bacteria, UA: NONE SEEN
Epithelial Cells (non renal): NONE SEEN /hpf (ref 0–10)
Renal Epithel, UA: NONE SEEN /hpf
WBC, UA: NONE SEEN /hpf (ref 0–5)

## 2019-11-15 MED ORDER — TAMSULOSIN HCL 0.4 MG PO CAPS: 0.4000 mg | ORAL_CAPSULE | Freq: Every day | ORAL | 3 refills | Status: DC

## 2019-11-15 NOTE — Progress Notes (Signed)
Urological Symptom Review  Patient is experiencing the following symptoms: Get up at night to urinate Erection problems (male only)   Review of Systems  Gastrointestinal (upper)  : Negative for upper GI symptoms  Gastrointestinal (lower) : Negative for lower GI symptoms  Constitutional : Night Sweats  Skin: Negative for skin symptoms  Eyes: Negative for eye symptoms  Ear/Nose/Throat : Sinus problems  Hematologic/Lymphatic: Negative for Hematologic/Lymphatic symptoms  Cardiovascular : Negative for cardiovascular symptoms  Respiratory : Negative for respiratory symptoms  Endocrine: Negative for endocrine symptoms  Musculoskeletal: Negative for musculoskeletal symptoms  Neurological: Negative for neurological symptoms  Psychologic: Negative for psychiatric symptoms

## 2019-11-15 NOTE — Progress Notes (Signed)
11/15/2019 10:21 AM   Vincent Obrien 1940-11-23 [id number]  Referring provider: Dione Housekeeper, MD [address] [place],  [place] [address]  followup prostate cancer  HPI: Vincent Obrien is a 79yo here for followup for prostate cancer, BPH and Erectile dysfunction. PSA increased to 0.7 from 0.4 6 months ago. Prostate cancer was treated with IMRT in 2018.  He is on flomax. Nocturia stable at 1-2x. Strong [place]. He has erectile dysfunction and tried tadalafil which failed to improve his ED.    PMH: Past Medical History:  Diagnosis Date   Cancer ([place])    SKIN CANCER   GERD (gastroesophageal reflux disease)    History of skin cancer    left ear   Hyperlipidemia    Hypertension    Sinus congestion     Surgical History: Past Surgical History:  Procedure Laterality Date   DEBRIDEMENT TENNIS ELBOW     arthroscopic surg left arm   ROTATOR CUFF REPAIR     right   SHOULDER SURGERY  2007   torn ligament left shoulder    Home Medications:  Allergies as of 11/15/2019   No Known Allergies     Medication List       Accurate as of November 15, 2019 10:21 AM. If you have any questions, ask your nurse or doctor.        aspirin 81 MG tablet Take 81 mg by mouth daily.   azelastine 0.1 % nasal spray Commonly known as: ASTELIN Place 2 sprays into both nostrils 2 (two) times daily.   dutasteride 0.5 MG capsule Commonly known as: AVODART Take 0.5 mg by mouth daily.   LIPITOR PO Take by mouth.   omeprazole 20 MG capsule Commonly known as: PRILOSEC Take 20 mg by mouth daily.   omeprazole 40 MG capsule Commonly known as: PRILOSEC Take 40 mg by mouth daily.   tadalafil 20 MG tablet Commonly known as: CIALIS Take 1 tablet (20 mg total) by mouth daily as needed for erectile dysfunction.   tamsulosin 0.4 MG Caps capsule Commonly known as: FLOMAX Take 1 capsule (0.4 mg total) by mouth daily.       Allergies: No Known Allergies  Family History: Family  History  Problem Relation Age of Onset   Breast cancer Mother    Colon cancer Neg Hx     Social History:  reports that he has never smoked. He has never used smokeless tobacco. He reports that he does not drink alcohol and does not use drugs.  ROS: All other review of systems were reviewed and are negative except what is noted above in HPI  Physical Exam: BP (!) 189/80    Pulse 65    Temp 97.8 F (36.6 C)    Ht 5\' 6"  (1.676 m)    Wt 152 lb (68.9 kg)    BMI 24.53 kg/m   Constitutional:  Alert and oriented, No acute distress. HEENT: [place] AT, moist mucus membranes.  Trachea midline, no masses. Cardiovascular: No clubbing, cyanosis, or edema. Respiratory: Normal respiratory effort, no increased work of breathing. GI: Abdomen is soft, nontender, nondistended, no abdominal masses GU: No CVA tenderness.  Lymph: No cervical or inguinal lymphadenopathy. Skin: No rashes, bruises or suspicious lesions. Neurologic: Grossly intact, no focal deficits, moving all 4 extremities. Psychiatric: Normal mood and affect.  Laboratory Data: No results found for: WBC, HGB, HCT, MCV, PLT  Lab Results  Component Value Date   CREATININE 1.40 (H) 09/07/2017    Lab Results  Component Value Date   PSA 0.4 04/03/2019    No results found for: TESTOSTERONE  No results found for: HGBA1C  Urinalysis No results found for: COLORURINE, APPEARANCEUR, LABSPEC, PHURINE, GLUCOSEU, HGBUR, BILIRUBINUR, KETONESUR, PROTEINUR, UROBILINOGEN, NITRITE, LEUKOCYTESUR  No results found for: LABMICR, [place], RBCUA, LABEPIT, MUCUS, BACTERIA  Pertinent Imaging:  No results found for this or any previous visit.  No results found for this or any previous visit.  No results found for this or any previous visit.  No results found for this or any previous visit.  No results found for this or any previous visit.  No results found for this or any previous visit.  No results found for this or any previous visit.  No  results found for this or any previous visit.   Assessment & Plan:    1. Prostate cancer ([place]) RTC 6 months with PSA  2. Benign prostatic hyperplasia with urinary obstruction Continue flomax0.4mg   3. Nocturia Continue flomax 0.4mg  Daily  4. Erectile dysfunction due to arterial insufficiency Patient defers therapy at this time   No follow-ups on file.  Nicolette Bang, MD  [facility] [place]

## 2019-11-15 NOTE — Patient Instructions (Signed)
Prostate Cancer  The prostate is a male gland that helps make semen. Prostate cancer is when abnormal cells grow in this gland. Follow these instructions at home:  Take over-the-counter and prescription medicines only as told by your doctor.  Eat a healthy diet.  Get plenty of sleep.  Ask your doctor for help to find a support group for men with prostate cancer.  Keep all follow-up visits as told by your doctor. This is important.  If you have to go to the hospital, let your cancer doctor (oncologist) know.  Touch, hold, hug, and caress your partner to continue to show sexual feelings. Contact a doctor if:  You have trouble peeing (urinating).  You have blood in your pee (urine).  You have pain in your hips, back, or chest. Get help right away if:  You have weakness in your legs.  You lose feeling (have numbness) in your legs.  You cannot control your pee or your poop (stool).  You have trouble breathing.  You have sudden pain in your chest.  You have chills or a fever. Summary  The prostate is a male gland that helps make semen. Prostate cancer is when abnormal cells grow in this gland.  Ask your doctor for help to find a support group for men with prostate cancer.  Contact a doctor if you have problems peeing or have any new pain that you did not have before. This information is not intended to replace advice given to you by your health care provider. Make sure you discuss any questions you have with your health care provider. Document Revised: 01/15/2017 Document Reviewed: 10/14/2015 Elsevier Patient Education  2020 Elsevier Inc.  

## 2019-11-15 NOTE — Addendum Note (Signed)
Addended by: Dorisann Frames on: 11/15/2019 01:43 PM   Modules accepted: Orders

## 2020-04-22 ENCOUNTER — Other Ambulatory Visit: Payer: Self-pay

## 2020-04-22 ENCOUNTER — Other Ambulatory Visit: Payer: PRIVATE HEALTH INSURANCE

## 2020-04-22 DIAGNOSIS — C61 Malignant neoplasm of prostate: Secondary | ICD-10-CM

## 2020-04-23 LAB — PSA: Prostate Specific Ag, Serum: 0.8 ng/mL (ref 0.0–4.0)

## 2020-04-30 NOTE — Progress Notes (Signed)
Sent via mychart

## 2020-05-06 ENCOUNTER — Other Ambulatory Visit: Payer: PRIVATE HEALTH INSURANCE

## 2020-05-08 ENCOUNTER — Other Ambulatory Visit: Payer: Self-pay | Admitting: Urology

## 2020-05-13 ENCOUNTER — Ambulatory Visit (INDEPENDENT_AMBULATORY_CARE_PROVIDER_SITE_OTHER): Payer: Medicare Other | Admitting: Urology

## 2020-05-13 ENCOUNTER — Encounter: Payer: Self-pay | Admitting: Urology

## 2020-05-13 ENCOUNTER — Other Ambulatory Visit: Payer: Self-pay

## 2020-05-13 VITALS — BP 182/71 | HR 45 | Temp 97.6°F | Ht 66.0 in | Wt 153.6 lb

## 2020-05-13 DIAGNOSIS — R351 Nocturia: Secondary | ICD-10-CM | POA: Diagnosis not present

## 2020-05-13 DIAGNOSIS — N138 Other obstructive and reflux uropathy: Secondary | ICD-10-CM | POA: Diagnosis not present

## 2020-05-13 DIAGNOSIS — C61 Malignant neoplasm of prostate: Secondary | ICD-10-CM

## 2020-05-13 DIAGNOSIS — N401 Enlarged prostate with lower urinary tract symptoms: Secondary | ICD-10-CM

## 2020-05-13 LAB — URINALYSIS, ROUTINE W REFLEX MICROSCOPIC
Bilirubin, UA: NEGATIVE
Leukocytes,UA: NEGATIVE
Nitrite, UA: NEGATIVE
Specific Gravity, UA: 1.02 (ref 1.005–1.030)
Urobilinogen, Ur: 1 mg/dL (ref 0.2–1.0)
pH, UA: 5.5 (ref 5.0–7.5)

## 2020-05-13 LAB — MICROSCOPIC EXAMINATION
Bacteria, UA: NONE SEEN
Epithelial Cells (non renal): NONE SEEN /hpf (ref 0–10)
Renal Epithel, UA: NONE SEEN /hpf
WBC, UA: NONE SEEN /hpf (ref 0–5)

## 2020-05-13 MED ORDER — TAMSULOSIN HCL 0.4 MG PO CAPS: 0.4000 mg | ORAL_CAPSULE | Freq: Every day | ORAL | 3 refills | Status: DC

## 2020-05-13 NOTE — Progress Notes (Signed)
05/13/2020 9:18 AM   Vincent Obrien 1940-10-22 [id number]  Referring provider: Chesley Noon, MD [place],  [place] [address]  followuop prostate cancer and BPH  HPI: Vincent Obrien is a 80yo hee ofr followup for prostate cancer and BPH with nocturia. PSA stable at 0.8, He has stable mild LUTS on flomax 0.4mg  daily. Stream strong, no urinary frequency or urgency. No other complaints today.   PMH: Past Medical History:  Diagnosis Date  . Cancer [facility])    SKIN CANCER  . GERD (gastroesophageal reflux disease)   . History of skin cancer    left ear  . Hyperlipidemia   . Hypertension   . Sinus congestion     Surgical History: Past Surgical History:  Procedure Laterality Date  . DEBRIDEMENT TENNIS ELBOW     arthroscopic surg left arm  . ROTATOR CUFF REPAIR     right  . SHOULDER SURGERY  2007   torn ligament left shoulder    Home Medications:  Allergies as of 05/13/2020   No Known Allergies     Medication List       Accurate as of May 13, 2020  9:18 AM. If you have any questions, ask your nurse or doctor.        aspirin 81 MG tablet Take 81 mg by mouth daily.   azelastine 0.1 % nasal spray Commonly known as: ASTELIN Place 2 sprays into both nostrils 2 (two) times daily.   dutasteride 0.5 MG capsule Commonly known as: AVODART Take 0.5 mg by mouth daily.   LIPITOR PO Take by mouth.   omeprazole 20 MG capsule Commonly known as: PRILOSEC Take 20 mg by mouth daily.   omeprazole 40 MG capsule Commonly known as: PRILOSEC Take 40 mg by mouth daily.   tadalafil 20 MG tablet Commonly known as: CIALIS Take 1 tablet (20 mg total) by mouth daily as needed for erectile dysfunction.   tamsulosin 0.4 MG Caps capsule Commonly known as: FLOMAX Take 1 capsule by mouth once daily       Allergies: No Known Allergies  Family History: Family History  Problem Relation Age of Onset  . Breast cancer Mother   . Colon cancer Neg Hx     Social  History:  reports that he has never smoked. He has never used smokeless tobacco. He reports that he does not drink alcohol and does not use drugs.  ROS: All other review of systems were reviewed and are negative except what is noted above in HPI  Physical Exam: BP (!) 182/71   Pulse (!) 45   Temp 97.6 F (36.4 C) (Oral)   Ht 5\' 6"  (1.676 m)   Wt 153 lb 9.6 oz (69.7 kg)   BMI 24.79 kg/m   Constitutional:  Alert and oriented, No acute distress. HEENT: [place] AT, moist mucus membranes.  Trachea midline, no masses. Cardiovascular: No clubbing, cyanosis, or edema. Respiratory: Normal respiratory effort, no increased work of breathing. GI: Abdomen is soft, nontender, nondistended, no abdominal masses GU: No CVA tenderness.  Lymph: No cervical or inguinal lymphadenopathy. Skin: No rashes, bruises or suspicious lesions. Neurologic: Grossly intact, no focal deficits, moving all 4 extremities. Psychiatric: Normal mood and affect.  Laboratory Data: No results found for: WBC, HGB, HCT, MCV, PLT  Lab Results  Component Value Date   CREATININE 1.40 (H) 09/07/2017    Lab Results  Component Value Date   PSA 0.4 04/03/2019    No results found for: TESTOSTERONE  No results found for: HGBA1C  Urinalysis    Component Value Date/Time   APPEARANCEUR Clear 11/15/2019 1349   GLUCOSEU Negative 11/15/2019 1349   BILIRUBINUR Negative 11/15/2019 1349   PROTEINUR 1+ (A) 11/15/2019 1349   NITRITE Negative 11/15/2019 1349   LEUKOCYTESUR Negative 11/15/2019 1349    Lab Results  Component Value Date   LABMICR See below: 11/15/2019   WBCUA None seen 11/15/2019   LABEPIT None seen 11/15/2019   MUCUS Present 11/15/2019   BACTERIA None seen 11/15/2019    Pertinent Imaging:  No results found for this or any previous visit.  No results found for this or any previous visit.  No results found for this or any previous visit.  No results found for this or any previous visit.  No results  found for this or any previous visit.  No results found for this or any previous visit.  No results found for this or any previous visit.  No results found for this or any previous visit.   Assessment & Plan:    1. Prostate cancer ([place]) RTC 1 year with PSA  2. Benign prostatic hyperplasia with urinary obstruction -continue flomax 0.4mg  daily  3. Nocturia Continue flomax 0.4mg  daily   No follow-ups on file.  Vincent Bang, MD  [facility] [place]

## 2020-05-21 ENCOUNTER — Encounter: Payer: Self-pay | Admitting: Urology

## 2020-05-21 NOTE — Patient Instructions (Signed)
Prostate Cancer  The prostate is a male gland that helps make semen. It is located below a man's bladder, in front of the rectum. Prostate cancer is when abnormal cells grow in this gland. What are the causes? The cause of this condition is not known. What increases the risk? You are more likely to develop this condition if:  You are 81 years of age or older.  You are African American.  You have a family history of prostate cancer.  You have a family history of breast cancer. What are the signs or symptoms? Symptoms of this condition include:  A need to pee often.  Peeing that is weak, or pee that stops and starts.  Trouble starting or stopping your pee.  Inability to pee.  Blood in your pee or semen.  Pain in the lower back, lower belly (abdomen), hips, or upper thighs.  Trouble getting an erection.  Trouble emptying all of your pee. How is this treated? Treatment for this condition depends on your age, your health, the kind of treatment you like, and how far the cancer has spread. Treatments include:  Being watched. This is called observation. You will be tested from time to time, but you will not get treated. Tests are to make sure that the cancer is not growing.  Surgery. This may be done to remove the prostate, to remove the testicles, or to freeze or kill cancer cells.  Radiation. This uses a strong beam to kill cancer cells.  Ultrasound energy. This uses strong sound waves to kill cancer cells.  Chemotherapy. This uses medicines that stop cancer cells from increasing. This kills cancer cells and healthy cells.  Targeted therapy. This kills cancer cells only. Healthy cells are not affected.  Hormone treatment. This stops the body from making hormones that help the cancer cells to grow. Follow these instructions at home:  Take over-the-counter and prescription medicines only as told by your doctor.  Eat a healthy diet.  Get plenty of sleep.  Ask your  doctor for help to find a support group for men with prostate cancer.  If you have to go to the hospital, let your cancer doctor (oncologist) know.  Treatment may affect your ability to have sex. Touch, hold, hug, and caress your partner to have intimate moments.  Keep all follow-up visits as told by your doctor. This is important. Contact a doctor if:  You have new or more trouble peeing.  You have new or more blood in your pee.  You have new or more pain in your hips, back, or chest. Get help right away if:  You have weakness in your legs.  You lose feeling in your legs.  You cannot control your pee or your poop (stool).  You have chills or a fever. Summary  The prostate is a male gland that helps make semen.  Prostate cancer is when abnormal cells grow in this gland.  Treatment includes doing surgery, using medicines, using very strong beams, or watching without treatment.  Ask your doctor for help to find a support group for men with prostate cancer.  Contact a doctor if you have problems peeing or have any new pain that you did not have before. This information is not intended to replace advice given to you by your health care provider. Make sure you discuss any questions you have with your health care provider. Document Revised: 01/17/2019 Document Reviewed: 01/17/2019 Elsevier Patient Education  2021 Elsevier Inc.  

## 2020-08-06 ENCOUNTER — Other Ambulatory Visit: Payer: Self-pay | Admitting: Urology

## 2020-08-06 DIAGNOSIS — N138 Other obstructive and reflux uropathy: Secondary | ICD-10-CM

## 2020-10-28 DIAGNOSIS — M1712 Unilateral primary osteoarthritis, left knee: Secondary | ICD-10-CM | POA: Insufficient documentation

## 2020-11-23 ENCOUNTER — Other Ambulatory Visit: Payer: Self-pay | Admitting: Urology

## 2020-11-23 DIAGNOSIS — N138 Other obstructive and reflux uropathy: Secondary | ICD-10-CM

## 2020-11-23 DIAGNOSIS — N401 Enlarged prostate with lower urinary tract symptoms: Secondary | ICD-10-CM

## 2020-12-02 ENCOUNTER — Ambulatory Visit: Payer: Medicare Other | Attending: Orthopaedic Surgery | Admitting: Physical Therapy

## 2020-12-02 ENCOUNTER — Other Ambulatory Visit: Payer: Self-pay

## 2020-12-02 ENCOUNTER — Encounter: Payer: Self-pay | Admitting: Physical Therapy

## 2020-12-02 DIAGNOSIS — R600 Localized edema: Secondary | ICD-10-CM | POA: Diagnosis present

## 2020-12-02 DIAGNOSIS — G8929 Other chronic pain: Secondary | ICD-10-CM | POA: Diagnosis present

## 2020-12-02 DIAGNOSIS — M25662 Stiffness of left knee, not elsewhere classified: Secondary | ICD-10-CM | POA: Insufficient documentation

## 2020-12-02 DIAGNOSIS — M25562 Pain in left knee: Secondary | ICD-10-CM | POA: Diagnosis not present

## 2020-12-02 NOTE — Therapy (Signed)
[facility] [place], [place], [address] Phone: [phone]   Fax:  [phone]  Physical Therapy Evaluation  Patient Details  Name: Vincent Obrien MRN: [id number] Date of Birth: 1940/11/23 Referring Provider (PT): Frazier Butt MD   Encounter Date: 12/02/2020   PT End of Session - 12/02/20 0900     Visit Number 1    Number of Visits 12    Date for PT Re-Evaluation 03/02/21    Authorization Type FOTO AT LEAST EVERY 5TH VISIT.  PROGRESS NOTE AT 10TH VISIT.  KX MODIFIER AFTER 15 VISITS.    PT Start Time 0818    PT Stop Time 5573    PT Time Calculation (min) 39 min    Activity Tolerance Patient tolerated treatment well    Behavior During Therapy WFL for tasks assessed/performed             Past Medical History:  Diagnosis Date   Cancer ([place])    SKIN CANCER   GERD (gastroesophageal reflux disease)    History of skin cancer    left ear   Hyperlipidemia    Hypertension    Sinus congestion     Past Surgical History:  Procedure Laterality Date   DEBRIDEMENT TENNIS ELBOW     arthroscopic surg left arm   ROTATOR CUFF REPAIR     right   SHOULDER SURGERY  2007   torn ligament left shoulder    There were no vitals filed for this visit.    Subjective Assessment - 12/02/20 0842     Subjective COVID-19 screen performed prior to patient entering clinic.  The patient presents to the clinic today s/p left total knee replacement performed on 11/12/20.  He is pleased with his surgical outcome thus far.  His pain-level at rest today is a 2/10.  He statesthat ice helps decrease pain and increased walking can increase hhis pain.  He came into the clinic today without assisitve device and is walking safely.  He had a previous total knee replacement on the right and began doing a HEP with his spouse helping him as needed.    Pertinent History Left elbow surgery, right TKA, right RTC repair, HTN, cochlear implant.    How  long can you walk comfortably? Around home without assisitve device.    Patient Stated Goals Get out of pain and do more.    Currently in Pain? Yes    Pain Score 2     Pain Location Knee    Pain Orientation Left    Pain Descriptors / Indicators Aching    Pain Type Surgical pain    Pain Onset More than a month ago    Pain Frequency Intermittent    Aggravating Factors  See above.    Pain Relieving Factors See above.                [facility] PT Assessment - 12/02/20 0001       Assessment   Medical Diagnosis Left total knee replacement.    Referring Provider (PT) Frazier Butt MD    Onset Date/Surgical Date 11/12/20      Precautions   Precaution Comments No ultrasound.      Restrictions   Weight Bearing Restrictions No      Balance Screen   Has the patient fallen in the past 6 months No    Has the patient had a decrease in activity level because of a fear of falling?  No  Is the patient reluctant to leave their home because of a fear of falling?  No      Home Environment   Living Environment Private residence      Prior Function   Level of Independence Independent      Observation/Other Assessments   Observations Steri-strips intact over left knee.    Focus on Therapeutic Outcomes (FOTO)  Complete.      Observation/Other Assessments-Edema    Edema Circumferential      Circumferential Edema   Circumferential - Left  LT 4 cms > RT.      ROM / Strength   AROM / PROM / Strength AROM;Strength      AROM   Overall AROM Comments -5 degrees to active left knee flexion to 105 degrees and passive to 110 degrees.      Strength   Overall Strength Comments Some loss of volitional left quadriceps contraction in supine but patient easliy able to perform a left antigravity SLR without extensor lag and a very good short arc quad against gravity.      Palpation   Palpation comment Mild left anterior knee pain.      Bed Mobility   Bed Mobility Supine to Sit    Supine  to Sit Independent      Ambulation/Gait   Gait Comments Essentially normal without assisitve device.                        Objective measurements completed on examination: See above findings.       [facility] Adult PT Treatment/Exercise - 12/02/20 0001       Modalities   Modalities Vasopneumatic      Vasopneumatic   Number Minutes Vasopneumatic  15 minutes    Vasopnuematic Location  --   Left knee.   Vasopneumatic Pressure Low                       PT Short Term Goals - 12/02/20 0930       PT SHORT TERM GOAL #1   Title Independent with an initial HEP.    Time 2    Period Weeks    Status New      PT SHORT TERM GOAL #2   Title Full active left knee extension in order to normalize gait.    Time 2    Period Weeks    Status New               PT Long Term Goals - 12/02/20 0929       PT LONG TERM GOAL #1   Title Independent with an advanced HEP.    Time 6    Period Weeks    Status New      PT LONG TERM GOAL #2   Title Active left knee flexion to 125 degrees+ so the patient can perform functional tasks and do so with pain not > 2-3/10.    Time 6    Period Weeks    Status New      PT LONG TERM GOAL #3   Title Increase left hip and knee strength to a solid 5/5 to provide good stability for accomplishment of functional activities.    Time 6    Period Weeks    Status New      PT LONG TERM GOAL #4   Title Decrease edema to within 2 cms of non-affected side to assist with pain reduction and range  of motion gains.    Time 6    Period Weeks    Status New      PT LONG TERM GOAL #5   Title Walk one mile with left knee pain not > 2/10.    Time 6    Period Weeks    Status New                    Plan - 12/02/20 0902     Clinical Impression Statement The patient presents to OPPT s/p left total knee replacement.  He is doing very well thus far.  He is walking safely without an assisitve device. He has some moderate left  knee edema.  He demonstrates some loss of volitional contraction ofher left knee in supine.  He is currently exbiting active left knee active range of motion from -5 to 105 degrees.  His pain was minimal today.  He is very motivated and has been compliant to a HEP (he has had a right TKA in the past).  Patient will benefit from skilled physical therapy intervention to address pain and deficits.    Personal Factors and Comorbidities Comorbidity 1    Comorbidities Left elbow surgery, right TKA, right RTC repair, HTN, cochlear implant.    Examination-Activity Limitations Other    Examination-Participation Restrictions Other    Stability/Clinical Decision Making Stable/Uncomplicated    Clinical Decision Making Low    Rehab Potential Excellent    PT Frequency 2x / week    PT Duration 6 weeks    PT Treatment/Interventions ADLs/Self Care Home Management;Cryotherapy;Electrical Stimulation;Moist Heat;Neuromuscular re-education;Therapeutic exercise;Therapeutic activities;Functional mobility training;Stair training;Patient/family education;Manual techniques;Passive range of motion;Scar mobilization;Vasopneumatic Device    PT Next Visit Plan Recumbent bike, VMS to left quadriceps during short arc quads, PROM, PRE's, vasopneumatic.    Consulted and Agree with Plan of Care Patient             Patient will benefit from skilled therapeutic intervention in order to improve the following deficits and impairments:  Pain, Decreased activity tolerance, Decreased strength, Increased edema, Decreased range of motion  Visit Diagnosis: Chronic pain of left knee - Plan: PT plan of care cert/re-cert  Stiffness of left knee, not elsewhere classified - Plan: PT plan of care cert/re-cert  Localized edema - Plan: PT plan of care cert/re-cert     Problem List Patient Active Problem List   Diagnosis Date Noted   Prostate cancer ([place]) 04/05/2019   Nocturia 04/05/2019   Benign prostatic hyperplasia with urinary  obstruction 04/05/2019   Erectile dysfunction due to arterial insufficiency 04/05/2019    [place], [name], PT 12/02/2020, 9:36 AM  [facility] [address] [place], [place], [address] Phone: [phone]   Fax:  [phone]  Name: DIXON LUCZAK MRN: [id number] Date of Birth: [birth year]

## 2020-12-05 ENCOUNTER — Ambulatory Visit: Payer: Medicare Other | Admitting: *Deleted

## 2020-12-05 DIAGNOSIS — M25562 Pain in left knee: Secondary | ICD-10-CM | POA: Diagnosis not present

## 2020-12-05 DIAGNOSIS — R600 Localized edema: Secondary | ICD-10-CM

## 2020-12-05 DIAGNOSIS — M25662 Stiffness of left knee, not elsewhere classified: Secondary | ICD-10-CM

## 2020-12-05 DIAGNOSIS — G8929 Other chronic pain: Secondary | ICD-10-CM

## 2020-12-05 NOTE — Therapy (Signed)
[facility] [place], [place], [address] Phone: [phone]   Fax:  [phone]  Physical Therapy Treatment  Patient Details  Name: Vincent Obrien MRN: [id number] Date of Birth: 13-Dec-1940 Referring Provider (PT): Frazier Butt MD   Encounter Date: 12/05/2020   PT End of Session - 12/05/20 1645     Visit Number 2    Number of Visits 12    Date for PT Re-Evaluation 03/02/21    Authorization Type FOTO AT LEAST EVERY 5TH VISIT.  PROGRESS NOTE AT 10TH VISIT.  KX MODIFIER AFTER 15 VISITS.    PT Start Time 1645    PT Stop Time 1735    PT Time Calculation (min) 50 min             Past Medical History:  Diagnosis Date   Cancer ([place])    SKIN CANCER   GERD (gastroesophageal reflux disease)    History of skin cancer    left ear   Hyperlipidemia    Hypertension    Sinus congestion     Past Surgical History:  Procedure Laterality Date   DEBRIDEMENT TENNIS ELBOW     arthroscopic surg left arm   ROTATOR CUFF REPAIR     right   SHOULDER SURGERY  2007   torn ligament left shoulder    There were no vitals filed for this visit.   Subjective Assessment - 12/05/20 1636     Subjective COVID-19 screen performed prior to patient entering clinic. Doing well. 2-3/10 pain    Pertinent History Left elbow surgery, right TKA, right RTC repair, HTN, cochlear implant.    How long can you walk comfortably? Around home without assisitve device.    Patient Stated Goals Get out of pain and do more.    Currently in Pain? Yes    Pain Score 3     Pain Location Knee    Pain Orientation Left    Pain Descriptors / Indicators Aching    Pain Type Surgical pain    Pain Onset More than a month ago                               [facility] Adult PT Treatment/Exercise - 12/05/20 0001       Exercises   Exercises Knee/Hip      Knee/Hip Exercises: Aerobic   Recumbent Bike L1 seat3 x    mins      Knee/Hip Exercises:  Standing   Lateral Step Up Left;3 sets;10 reps;Step Height: 6";Hand Hold: 1    Forward Step Up 2 sets;10 reps;Step Height: 6";Hand Hold: 1    Rocker Board 4 minutes   PF/DF and balance   SLS LT LE      Modalities   Modalities Vasopneumatic      Vasopneumatic   Number Minutes Vasopneumatic  15 minutes    Vasopnuematic Location  Knee    Vasopneumatic Pressure Low    Vasopneumatic Temperature  34/ EDEMA      Manual Therapy   Manual Therapy Passive ROM    Passive ROM PROM for flexion and extension                       PT Short Term Goals - 12/02/20 0930       PT SHORT TERM GOAL #1   Title Independent with an initial HEP.    Time 2    Period  Weeks    Status New      PT SHORT TERM GOAL #2   Title Full active left knee extension in order to normalize gait.    Time 2    Period Weeks    Status New               PT Long Term Goals - 12/02/20 0929       PT LONG TERM GOAL #1   Title Independent with an advanced HEP.    Time 6    Period Weeks    Status New      PT LONG TERM GOAL #2   Title Active left knee flexion to 125 degrees+ so the patient can perform functional tasks and do so with pain not > 2-3/10.    Time 6    Period Weeks    Status New      PT LONG TERM GOAL #3   Title Increase left hip and knee strength to a solid 5/5 to provide good stability for accomplishment of functional activities.    Time 6    Period Weeks    Status New      PT LONG TERM GOAL #4   Title Decrease edema to within 2 cms of non-affected side to assist with pain reduction and range of motion gains.    Time 6    Period Weeks    Status New      PT LONG TERM GOAL #5   Title Walk one mile with left knee pain not > 2/10.    Time 6    Period Weeks    Status New                   Plan - 12/05/20 1743     Clinical Impression Statement Pt arrived today doing fairly well with no A/D. He was able to complete CKC and OKC exs without complaint. Normal vaso end  of session    Personal Factors and Comorbidities Comorbidity 1    Comorbidities Left elbow surgery, right TKA, right RTC repair, HTN, cochlear implant.    Examination-Participation Restrictions Other    Stability/Clinical Decision Making Stable/Uncomplicated    Rehab Potential Excellent    PT Frequency 2x / week    PT Duration 6 weeks    PT Treatment/Interventions ADLs/Self Care Home Management;Cryotherapy;Electrical Stimulation;Moist Heat;Neuromuscular re-education;Therapeutic exercise;Therapeutic activities;Functional mobility training;Stair training;Patient/family education;Manual techniques;Passive range of motion;Scar mobilization;Vasopneumatic Device    PT Next Visit Plan Recumbent bike, VMS to left quadriceps during short arc quads, PROM, PRE's, vasopneumatic.    Consulted and Agree with Plan of Care Patient             Patient will benefit from skilled therapeutic intervention in order to improve the following deficits and impairments:  Pain, Decreased activity tolerance, Decreased strength, Increased edema, Decreased range of motion  Visit Diagnosis: Chronic pain of left knee  Stiffness of left knee, not elsewhere classified  Localized edema     Problem List Patient Active Problem List   Diagnosis Date Noted   Prostate cancer ([place]) 04/05/2019   Nocturia 04/05/2019   Benign prostatic hyperplasia with urinary obstruction 04/05/2019   Erectile dysfunction due to arterial insufficiency 04/05/2019    Vincent Obrien,CHRIS, PTA 12/05/2020, 5:51 PM  [facility] [address] [place], [place], [address] Phone: [phone]   Fax:  [phone]  Name: Vincent Obrien MRN: [id number] Date of Birth: 1940/03/30

## 2020-12-06 ENCOUNTER — Ambulatory Visit: Payer: Medicare Other

## 2020-12-09 ENCOUNTER — Encounter: Payer: Self-pay | Admitting: Physical Therapy

## 2020-12-09 ENCOUNTER — Other Ambulatory Visit: Payer: Self-pay

## 2020-12-09 ENCOUNTER — Ambulatory Visit: Payer: Medicare Other | Admitting: Physical Therapy

## 2020-12-09 DIAGNOSIS — M25562 Pain in left knee: Secondary | ICD-10-CM

## 2020-12-09 DIAGNOSIS — M25662 Stiffness of left knee, not elsewhere classified: Secondary | ICD-10-CM

## 2020-12-09 DIAGNOSIS — G8929 Other chronic pain: Secondary | ICD-10-CM

## 2020-12-09 DIAGNOSIS — R600 Localized edema: Secondary | ICD-10-CM

## 2020-12-09 NOTE — Therapy (Signed)
[facility] [place], [place], [address] Phone: [phone]   Fax:  [phone]  Physical Therapy Treatment  Patient Details  Name: Vincent Obrien MRN: [id number] Date of Birth: [birth year] Referring Provider (PT): Frazier Butt MD   Encounter Date: 12/09/2020   PT End of Session - 12/09/20 0949     Visit Number 3    Number of Visits 12    Date for PT Re-Evaluation 03/02/21    Authorization Type FOTO AT LEAST EVERY 5TH VISIT.  PROGRESS NOTE AT 10TH VISIT.  KX MODIFIER AFTER 15 VISITS.    PT Start Time [id number]    Activity Tolerance Patient tolerated treatment well    Behavior During Therapy WFL for tasks assessed/performed             Past Medical History:  Diagnosis Date   Cancer ([place])    SKIN CANCER   GERD (gastroesophageal reflux disease)    History of skin cancer    left ear   Hyperlipidemia    Hypertension    Sinus congestion     Past Surgical History:  Procedure Laterality Date   DEBRIDEMENT TENNIS ELBOW     arthroscopic surg left arm   ROTATOR CUFF REPAIR     right   SHOULDER SURGERY  2007   torn ligament left shoulder    There were no vitals filed for this visit.   Subjective Assessment - 12/09/20 0948     Subjective COVID-19 screen performed prior to patient entering clinic. Doing well. Pain is worse at night.    Pertinent History Left elbow surgery, right TKA, right RTC repair, HTN, cochlear implant.    How long can you walk comfortably? Around home without assisitve device.    Patient Stated Goals Get out of pain and do more.    Currently in Pain? No/denies                [facility] PT Assessment - 12/09/20 0001       Assessment   Medical Diagnosis Left total knee replacement.    Referring Provider (PT) Frazier Butt MD    Onset Date/Surgical Date 11/12/20    Next MD Visit 12/27/2020      Precautions   Precaution Comments No ultrasound.      Restrictions   Weight  Bearing Restrictions No      ROM / Strength   AROM / PROM / Strength AROM      AROM   Overall AROM  Within functional limits for tasks performed    AROM Assessment Site Knee    Right/Left Knee Left    Left Knee Extension 4    Left Knee Flexion 127                           [facility] Adult PT Treatment/Exercise - 12/09/20 0001       Knee/Hip Exercises: Aerobic   Recumbent Bike L1, seat 2 x16 min      Knee/Hip Exercises: Standing   Forward Lunges Left;2 sets;10 reps;3 seconds;Limitations    Forward Lunges Limitations 8" step    Terminal Knee Extension Strengthening;Left;2 sets;10 reps;Theraband    Theraband Level (Terminal Knee Extension) Level 2 (Red)    Lateral Step Up Left;15 reps;Hand Hold: 2;Step Height: 4"    Forward Step Up Left;2 sets;10 reps;Hand Hold: 2;Step Height: 6"    Step Down Left;2 sets;10 reps;Hand Hold: 2;Step Height: 4Armed forces technical officer  3 minutes      Knee/Hip Exercises: Supine   Short Arc Quad Sets Strengthening;Left;3 sets;10 reps;Limitations    Short Arc Quad Sets Limitations 3#    Straight Leg Raises Strengthening;Left;2 sets;10 reps      Modalities   Modalities Vasopneumatic      Vasopneumatic   Number Minutes Vasopneumatic  10 minutes    Vasopnuematic Location  Knee    Vasopneumatic Pressure Medium    Vasopneumatic Temperature  34/edema                       PT Short Term Goals - 12/09/20 1022       PT SHORT TERM GOAL #1   Title Independent with an initial HEP.    Time 2    Period Weeks    Status On-going      PT SHORT TERM GOAL #2   Title Full active left knee extension in order to normalize gait.    Time 2    Period Weeks    Status On-going               PT Long Term Goals - 12/09/20 1030       PT LONG TERM GOAL #1   Title Independent with an advanced HEP.    Time 6    Period Weeks    Status On-going      PT LONG TERM GOAL #2   Title Active left knee flexion to 125 degrees+ so the patient  can perform functional tasks and do so with pain not > 2-3/10.    Time 6    Period Weeks    Status Achieved      PT LONG TERM GOAL #3   Title Increase left hip and knee strength to a solid 5/5 to provide good stability for accomplishment of functional activities.    Time 6    Period Weeks    Status On-going      PT LONG TERM GOAL #4   Title Decrease edema to within 2 cms of non-affected side to assist with pain reduction and range of motion gains.    Time 6    Period Weeks    Status On-going      PT LONG TERM GOAL #5   Title Walk one mile with left knee pain not > 2/10.    Time 6    Period Weeks    Status On-going                   Plan - 12/09/20 1023     Clinical Impression Statement Patient presented in clinic with reports of minima current L knee pain. Patient indicates more pain at night. Patient able to tolerate all therex well as he is very independent almost four weeks post op. Patient reports being very active while at home and prior to L knee pain beginning was walking up to 5 miles a day. Patient able to demonstrate good L quad activation and strength. No AD used during treatment. All steristrips still in contact over the L knee incision. Normal vasopneumatic response noted following removal of the modality.    Personal Factors and Comorbidities Comorbidity 1    Comorbidities Left elbow surgery, right TKA, right RTC repair, HTN, cochlear implant.    Examination-Activity Limitations Other    Examination-Participation Restrictions Other    Stability/Clinical Decision Making Stable/Uncomplicated    Rehab Potential Excellent    PT Frequency 2x / week  PT Duration 6 weeks    PT Treatment/Interventions ADLs/Self Care Home Management;Cryotherapy;Electrical Stimulation;Moist Heat;Neuromuscular re-education;Therapeutic exercise;Therapeutic activities;Functional mobility training;Stair training;Patient/family education;Manual techniques;Passive range of motion;Scar  mobilization;Vasopneumatic Device    PT Next Visit Plan Recumbent bike, VMS to left quadriceps during short arc quads, PROM, PRE's, vasopneumatic.    Consulted and Agree with Plan of Care Patient             Patient will benefit from skilled therapeutic intervention in order to improve the following deficits and impairments:  Pain, Decreased activity tolerance, Decreased strength, Increased edema, Decreased range of motion  Visit Diagnosis: Stiffness of left knee, not elsewhere classified  Chronic pain of left knee  Localized edema     Problem List Patient Active Problem List   Diagnosis Date Noted   Prostate cancer ([place]) 04/05/2019   Nocturia 04/05/2019   Benign prostatic hyperplasia with urinary obstruction 04/05/2019   Erectile dysfunction due to arterial insufficiency 04/05/2019    Standley Brooking, PTA 12/09/2020, 10:30 AM  [facility] [place], [place], [address] Phone: [phone]   Fax:  [phone]  Name: HENDRIX YURKOVICH MRN: [id number] Date of Birth: 1940-05-07

## 2020-12-12 ENCOUNTER — Ambulatory Visit: Payer: Medicare Other | Admitting: Physical Therapy

## 2020-12-12 ENCOUNTER — Other Ambulatory Visit: Payer: Self-pay

## 2020-12-12 DIAGNOSIS — M25562 Pain in left knee: Secondary | ICD-10-CM | POA: Diagnosis not present

## 2020-12-12 DIAGNOSIS — M25662 Stiffness of left knee, not elsewhere classified: Secondary | ICD-10-CM

## 2020-12-12 DIAGNOSIS — G8929 Other chronic pain: Secondary | ICD-10-CM

## 2020-12-12 DIAGNOSIS — R600 Localized edema: Secondary | ICD-10-CM

## 2020-12-12 NOTE — Therapy (Signed)
[facility] [place], [place], [address] Phone: [phone]   Fax:  [phone]  Physical Therapy Treatment  Patient Details  Name: GENEROSO Obrien MRN: [id number] Date of Birth: July 23, 1940 Referring Provider (PT): Frazier Butt MD   Encounter Date: 12/12/2020   PT End of Session - 12/12/20 1802     Visit Number 5    Number of Visits 12    Date for PT Re-Evaluation 03/02/21    Authorization Type FOTO AT LEAST EVERY 5TH VISIT.  PROGRESS NOTE AT 10TH VISIT.  KX MODIFIER AFTER 15 VISITS.    PT Start Time 0445    PT Stop Time 0539    PT Time Calculation (min) 54 min    Activity Tolerance Patient tolerated treatment well    Behavior During Therapy WFL for tasks assessed/performed             Past Medical History:  Diagnosis Date   Cancer ([place])    SKIN CANCER   GERD (gastroesophageal reflux disease)    History of skin cancer    left ear   Hyperlipidemia    Hypertension    Sinus congestion     Past Surgical History:  Procedure Laterality Date   DEBRIDEMENT TENNIS ELBOW     arthroscopic surg left arm   ROTATOR CUFF REPAIR     right   SHOULDER SURGERY  2007   torn ligament left shoulder    There were no vitals filed for this visit.   Subjective Assessment - 12/12/20 1801     Subjective COVID-19 screen performed prior to patient entering clinic.  No new complaints.    Pertinent History Left elbow surgery, right TKA, right RTC repair, HTN, cochlear implant.    How long can you walk comfortably? Around home without assisitve device.    Patient Stated Goals Get out of pain and do more.    Currently in Pain? Yes    Pain Score 3     Pain Location Knee    Pain Orientation Left    Pain Descriptors / Indicators Aching    Pain Onset More than a month ago                [facility] PT Assessment - 12/12/20 0001       AROM   AROM Assessment Site Knee    Right/Left Knee Left    Left Knee Extension 4     Left Knee Flexion 127                           [facility] Adult PT Treatment/Exercise - 12/12/20 0001       Exercises   Exercises Knee/Hip      Knee/Hip Exercises: Aerobic   Recumbent Bike Level 3 progressing to seat 1 over 15 minutes.      Knee/Hip Exercises: Machines for Strengthening   Cybex Knee Extension 10# x 3 minutes.    Cybex Knee Flexion 30# x 3 minutes.    Cybex Leg Press 2 plates x 3 minutes.      Modalities   Modalities Vasopneumatic      Vasopneumatic   Number Minutes Vasopneumatic  20 minutes    Vasopnuematic Location  --   Left knee.   Vasopneumatic Pressure Medium                       PT Short Term Goals - 12/09/20 1022  PT SHORT TERM GOAL #1   Title Independent with an initial HEP.    Time 2    Period Weeks    Status On-going      PT SHORT TERM GOAL #2   Title Full active left knee extension in order to normalize gait.    Time 2    Period Weeks    Status On-going               PT Long Term Goals - 12/09/20 1030       PT LONG TERM GOAL #1   Title Independent with an advanced HEP.    Time 6    Period Weeks    Status On-going      PT LONG TERM GOAL #2   Title Active left knee flexion to 125 degrees+ so the patient can perform functional tasks and do so with pain not > 2-3/10.    Time 6    Period Weeks    Status Achieved      PT LONG TERM GOAL #3   Title Increase left hip and knee strength to a solid 5/5 to provide good stability for accomplishment of functional activities.    Time 6    Period Weeks    Status On-going      PT LONG TERM GOAL #4   Title Decrease edema to within 2 cms of non-affected side to assist with pain reduction and range of motion gains.    Time 6    Period Weeks    Status On-going      PT LONG TERM GOAL #5   Title Walk one mile with left knee pain not > 2/10.    Time 6    Period Weeks    Status On-going                   Plan - 12/12/20 1806     Clinical  Impression Statement Patient did a great job today progressing to seat one on the recumbent bike and adding weight machines which he performed with excellent technique and no complaints.    Personal Factors and Comorbidities Comorbidity 1    Comorbidities Left elbow surgery, right TKA, right RTC repair, HTN, cochlear implant.    Examination-Activity Limitations Other    Examination-Participation Restrictions Other    Stability/Clinical Decision Making Stable/Uncomplicated    Rehab Potential Excellent    PT Frequency 2x / week    PT Treatment/Interventions ADLs/Self Care Home Management;Cryotherapy;Electrical Stimulation;Moist Heat;Neuromuscular re-education;Therapeutic exercise;Therapeutic activities;Functional mobility training;Stair training;Patient/family education;Manual techniques;Passive range of motion;Scar mobilization;Vasopneumatic Device    PT Next Visit Plan Recumbent bike, VMS to left quadriceps during short arc quads, PROM, PRE's, vasopneumatic.    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 [place], [place], [address] Phone: [phone]  Fax:  [phone]  Name: Vincent Obrien MRN: [id number] Date of Birth: 1940/07/28

## 2020-12-16 ENCOUNTER — Other Ambulatory Visit: Payer: Self-pay

## 2020-12-16 ENCOUNTER — Encounter: Payer: Self-pay | Admitting: Physical Therapy

## 2020-12-16 ENCOUNTER — Ambulatory Visit: Payer: Medicare Other | Admitting: Physical Therapy

## 2020-12-16 DIAGNOSIS — R600 Localized edema: Secondary | ICD-10-CM

## 2020-12-16 DIAGNOSIS — M25662 Stiffness of left knee, not elsewhere classified: Secondary | ICD-10-CM

## 2020-12-16 DIAGNOSIS — M25562 Pain in left knee: Secondary | ICD-10-CM | POA: Diagnosis not present

## 2020-12-16 DIAGNOSIS — G8929 Other chronic pain: Secondary | ICD-10-CM

## 2020-12-16 NOTE — Therapy (Signed)
[facility] [place], [place], [address] Phone: [phone]   Fax:  [phone]  Physical Therapy Treatment  Patient Details  Name: Vincent Obrien MRN: [id number] Date of Birth: [birth year] Referring Provider (PT): Frazier Butt MD   Encounter Date: 12/16/2020   PT End of Session - 12/16/20 0817     Visit Number 6    Number of Visits 12    Date for PT Re-Evaluation 03/02/21    Authorization Type FOTO AT LEAST EVERY 5TH VISIT.  PROGRESS NOTE AT 10TH VISIT.  KX MODIFIER AFTER 15 VISITS.    PT Start Time [phone]    PT Stop Time 0858    PT Time Calculation (min) 42 min    Activity Tolerance Patient tolerated treatment well    Behavior During Therapy WFL for tasks assessed/performed             Past Medical History:  Diagnosis Date   Cancer ([place])    SKIN CANCER   GERD (gastroesophageal reflux disease)    History of skin cancer    left ear   Hyperlipidemia    Hypertension    Sinus congestion     Past Surgical History:  Procedure Laterality Date   DEBRIDEMENT TENNIS ELBOW     arthroscopic surg left arm   ROTATOR CUFF REPAIR     right   SHOULDER SURGERY  2007   torn ligament left shoulder    There were no vitals filed for this visit.   Subjective Assessment - 12/16/20 0815     Subjective COVID-19 screen performed prior to patient entering clinic. More discomfort at night.    Pertinent History Left elbow surgery, right TKA, right RTC repair, HTN, cochlear implant.    How long can you walk comfortably? Around home without assisitve device.    Patient Stated Goals Get out of pain and do more.    Currently in Pain? No/denies                [facility] PT Assessment - 12/16/20 0001       Assessment   Medical Diagnosis Left total knee replacement.    Referring Provider (PT) Frazier Butt MD    Onset Date/Surgical Date 11/12/20    Next MD Visit 12/27/2020      Precautions   Precaution Comments No  ultrasound.                           [facility] Adult PT Treatment/Exercise - 12/16/20 0001       Knee/Hip Exercises: Aerobic   Recumbent Bike L4, seat 2 x15 min      Knee/Hip Exercises: Machines for Strengthening   Cybex Knee Extension 10# 3x10 reps    Cybex Knee Flexion 30# 3x10 reps    Cybex Leg Press 2 pl, seat 6 x30 reps      Knee/Hip Exercises: Standing   Terminal Knee Extension Strengthening;Left;2 sets;10 reps;Theraband    Theraband Level (Terminal Knee Extension) Level 3 (Green)    Hip Abduction Stengthening;Left;2 sets;10 reps;Knee straight;Limitations    Abduction Limitations green theraband    Hip Extension Stengthening;Left;2 sets;10 reps;Knee straight;Limitations    Extension Limitations green theraband      Knee/Hip Exercises: Seated   Sit to Sand 20 reps;without UE support   2" step under RLE     Knee/Hip Exercises: Supine   Bridges Strengthening;Both;20 reps    Straight Leg Raises AROM;Left;2 sets;10 reps  Modalities   Modalities Vasopneumatic      Vasopneumatic   Number Minutes Vasopneumatic  10 minutes    Vasopnuematic Location  Knee    Vasopneumatic Pressure Medium    Vasopneumatic Temperature  34/edema                       PT Short Term Goals - 12/09/20 1022       PT SHORT TERM GOAL #1   Title Independent with an initial HEP.    Time 2    Period Weeks    Status On-going      PT SHORT TERM GOAL #2   Title Full active left knee extension in order to normalize gait.    Time 2    Period Weeks    Status On-going               PT Long Term Goals - 12/09/20 1030       PT LONG TERM GOAL #1   Title Independent with an advanced HEP.    Time 6    Period Weeks    Status On-going      PT LONG TERM GOAL #2   Title Active left knee flexion to 125 degrees+ so the patient can perform functional tasks and do so with pain not > 2-3/10.    Time 6    Period Weeks    Status Achieved      PT LONG TERM GOAL #3    Title Increase left hip and knee strength to a solid 5/5 to provide good stability for accomplishment of functional activities.    Time 6    Period Weeks    Status On-going      PT LONG TERM GOAL #4   Title Decrease edema to within 2 cms of non-affected side to assist with pain reduction and range of motion gains.    Time 6    Period Weeks    Status On-going      PT LONG TERM GOAL #5   Title Walk one mile with left knee pain not > 2/10.    Time 6    Period Weeks    Status On-going                   Plan - 12/16/20 0900     Clinical Impression Statement Patient presented in clinic with reports of mainly minimal pain at night. Patient is very active outside of PT and eager to return to golf. Patient progressing well with knee and hip strengthening with resistance. Patient able to demonstrate minimal compensation of the LLE but mild WB deviation to RLE noted. Min to mod edema notable surrounding R patella. Normal vasopneumatic response noted following removal of the modalities. Patient has a flight of stairs at home and reports that he is now ascending and descending reciprically without problem.    Personal Factors and Comorbidities Comorbidity 1    Comorbidities Left elbow surgery, right TKA, right RTC repair, HTN, cochlear implant.    Examination-Activity Limitations Other    Examination-Participation Restrictions Other    Stability/Clinical Decision Making Stable/Uncomplicated    Rehab Potential Excellent    PT Frequency 2x / week    PT Duration 6 weeks    PT Treatment/Interventions ADLs/Self Care Home Management;Cryotherapy;Electrical Stimulation;Moist Heat;Neuromuscular re-education;Therapeutic exercise;Therapeutic activities;Functional mobility training;Stair training;Patient/family education;Manual techniques;Passive range of motion;Scar mobilization;Vasopneumatic Device    PT Next Visit Plan Recumbent bike, VMS to left quadriceps during short arc quads,  PROM, PRE's,  vasopneumatic.    Consulted and Agree with Plan of Care Patient             Patient will benefit from skilled therapeutic intervention in order to improve the following deficits and impairments:  Pain, Decreased activity tolerance, Decreased strength, Increased edema, Decreased range of motion  Visit Diagnosis: Stiffness of left knee, not elsewhere classified  Chronic pain of left knee  Localized edema     Problem List Patient Active Problem List   Diagnosis Date Noted   Prostate cancer ([place]) 04/05/2019   Nocturia 04/05/2019   Benign prostatic hyperplasia with urinary obstruction 04/05/2019   Erectile dysfunction due to arterial insufficiency 04/05/2019    Standley Brooking, PTA 12/16/2020, 9:05 AM  [facility] [address] [place], [place], [address] Phone: [phone]   Fax:  [phone]  Name: Vincent Obrien MRN: [id number] Date of Birth: [birth year]

## 2020-12-20 ENCOUNTER — Encounter: Payer: Self-pay | Admitting: Physical Therapy

## 2020-12-20 ENCOUNTER — Other Ambulatory Visit: Payer: Self-pay

## 2020-12-20 ENCOUNTER — Ambulatory Visit: Payer: Medicare Other | Attending: Orthopaedic Surgery | Admitting: Physical Therapy

## 2020-12-20 DIAGNOSIS — M25662 Stiffness of left knee, not elsewhere classified: Secondary | ICD-10-CM | POA: Diagnosis present

## 2020-12-20 DIAGNOSIS — M25562 Pain in left knee: Secondary | ICD-10-CM | POA: Insufficient documentation

## 2020-12-20 DIAGNOSIS — G8929 Other chronic pain: Secondary | ICD-10-CM | POA: Diagnosis present

## 2020-12-20 DIAGNOSIS — R600 Localized edema: Secondary | ICD-10-CM | POA: Insufficient documentation

## 2020-12-20 NOTE — Therapy (Signed)
[facility] [place], [place], [address] Phone: [phone]   Fax:  [phone]  Physical Therapy Treatment  Patient Details  Name: Vincent Obrien MRN: [id number] Date of Birth: 27-Nov-1940 Referring Provider (PT): Frazier Butt MD   Encounter Date: 12/20/2020   PT End of Session - 12/20/20 1241     Visit Number 7    Number of Visits 12    Date for PT Re-Evaluation 03/02/21    Authorization Type FOTO AT LEAST EVERY 5TH VISIT.  PROGRESS NOTE AT 10TH VISIT.  KX MODIFIER AFTER 15 VISITS.    PT Start Time [phone]    PT Stop Time 1032    PT Time Calculation (min) 45 min    Activity Tolerance Patient tolerated treatment well    Behavior During Therapy WFL for tasks assessed/performed             Past Medical History:  Diagnosis Date   Cancer ([place])    SKIN CANCER   GERD (gastroesophageal reflux disease)    History of skin cancer    left ear   Hyperlipidemia    Hypertension    Sinus congestion     Past Surgical History:  Procedure Laterality Date   DEBRIDEMENT TENNIS ELBOW     arthroscopic surg left arm   ROTATOR CUFF REPAIR     right   SHOULDER SURGERY  2007   torn ligament left shoulder    There were no vitals filed for this visit.   Subjective Assessment - 12/20/20 0950     Subjective COVID-19 screen performed prior to patient entering clinic. More discomfort at night with burning felt.    Pertinent History Left elbow surgery, right TKA, right RTC repair, HTN, cochlear implant.    How long can you walk comfortably? Around home without assisitve device.    Patient Stated Goals Get out of pain and do more.    Currently in Pain? No/denies                [facility] PT Assessment - 12/20/20 0001       Assessment   Medical Diagnosis Left total knee replacement.    Referring Provider (PT) Frazier Butt MD    Onset Date/Surgical Date 11/12/20    Next MD Visit 12/27/2020      Precautions    Precaution Comments No ultrasound.                           [facility] Adult PT Treatment/Exercise - 12/20/20 0001       Knee/Hip Exercises: Aerobic   Recumbent Bike L4, seat 2 x15 min      Knee/Hip Exercises: Machines for Strengthening   Cybex Knee Extension 10# 3x10 reps   eccentric lowering   Cybex Knee Flexion 30# 3x10 reps   eccentric return   Cybex Leg Press 2 pl, seat 5 x30 reps      Knee/Hip Exercises: Standing   Lateral Step Up Left;20 reps;Hand Hold: 2;Step Height: 4"    Step Down Left;20 reps;Hand Hold: 2;Step Height: 4"    Walking with Sports Cord Backwards, sideways orange XTS x15      Modalities   Modalities Vasopneumatic      Vasopneumatic   Number Minutes Vasopneumatic  10 minutes    Vasopnuematic Location  Knee    Vasopneumatic Pressure Medium    Vasopneumatic Temperature  34/edema  PT Short Term Goals - 12/09/20 1022       PT SHORT TERM GOAL #1   Title Independent with an initial HEP.    Time 2    Period Weeks    Status On-going      PT SHORT TERM GOAL #2   Title Full active left knee extension in order to normalize gait.    Time 2    Period Weeks    Status On-going               PT Long Term Goals - 12/09/20 1030       PT LONG TERM GOAL #1   Title Independent with an advanced HEP.    Time 6    Period Weeks    Status On-going      PT LONG TERM GOAL #2   Title Active left knee flexion to 125 degrees+ so the patient can perform functional tasks and do so with pain not > 2-3/10.    Time 6    Period Weeks    Status Achieved      PT LONG TERM GOAL #3   Title Increase left hip and knee strength to a solid 5/5 to provide good stability for accomplishment of functional activities.    Time 6    Period Weeks    Status On-going      PT LONG TERM GOAL #4   Title Decrease edema to within 2 cms of non-affected side to assist with pain reduction and range of motion gains.    Time 6    Period  Weeks    Status On-going      PT LONG TERM GOAL #5   Title Walk one mile with left knee pain not > 2/10.    Time 6    Period Weeks    Status On-going                   Plan - 12/20/20 1242     Clinical Impression Statement Patient continues to excel following TKR. Patient does report more discomfort and burning sensation at night which limits his ability to rest and sleep. Patient remains very active outside of PT as well. Edema observed surrounding the L knee as well mildly. Patient progressed in regards to resistive training today but no complaints of increased pain. Normal vasopneumatic response noted following removal of the modality.    Personal Factors and Comorbidities Comorbidity 1    Comorbidities Left elbow surgery, right TKA, right RTC repair, HTN, cochlear implant.    Examination-Activity Limitations Other    Examination-Participation Restrictions Other    Stability/Clinical Decision Making Stable/Uncomplicated    Rehab Potential Excellent    PT Frequency 2x / week    PT Duration 6 weeks    PT Treatment/Interventions ADLs/Self Care Home Management;Cryotherapy;Electrical Stimulation;Moist Heat;Neuromuscular re-education;Therapeutic exercise;Therapeutic activities;Functional mobility training;Stair training;Patient/family education;Manual techniques;Passive range of motion;Scar mobilization;Vasopneumatic Device    PT Next Visit Plan Recumbent bike, VMS to left quadriceps during short arc quads, PROM, PRE's, vasopneumatic.    Consulted and Agree with Plan of Care Patient             Patient will benefit from skilled therapeutic intervention in order to improve the following deficits and impairments:  Pain, Decreased activity tolerance, Decreased strength, Increased edema, Decreased range of motion  Visit Diagnosis: Stiffness of left knee, not elsewhere classified  Chronic pain of left knee  Localized edema     Problem List Patient Active Problem List  Diagnosis Date Noted   Prostate cancer ([place]) 04/05/2019   Nocturia 04/05/2019   Benign prostatic hyperplasia with urinary obstruction 04/05/2019   Erectile dysfunction due to arterial insufficiency 04/05/2019    Standley Brooking, PTA 12/20/2020, 12:44 PM  [facility] [address] [place], [place], [address] Phone: [phone]   Fax:  [phone]  Name: Vincent Obrien MRN: [id number] Date of Birth: 1940/12/07

## 2020-12-23 ENCOUNTER — Ambulatory Visit: Payer: Medicare Other | Admitting: Physical Therapy

## 2020-12-23 ENCOUNTER — Encounter: Payer: Self-pay | Admitting: Physical Therapy

## 2020-12-23 DIAGNOSIS — R600 Localized edema: Secondary | ICD-10-CM

## 2020-12-23 DIAGNOSIS — M25662 Stiffness of left knee, not elsewhere classified: Secondary | ICD-10-CM | POA: Diagnosis not present

## 2020-12-23 DIAGNOSIS — G8929 Other chronic pain: Secondary | ICD-10-CM

## 2020-12-23 NOTE — Therapy (Signed)
[facility] [place], [place], [address] Phone: [phone]   Fax:  [phone]  Physical Therapy Treatment  Patient Details  Name: Vincent Obrien MRN: [id number] Date of Birth: January 05, 1941 Referring Provider (PT): Frazier Butt MD   Encounter Date: 12/23/2020   PT End of Session - 12/23/20 1557     Visit Number 8    Number of Visits 12    Date for PT Re-Evaluation 03/02/21    Authorization Type FOTO AT LEAST EVERY 5TH VISIT.  PROGRESS NOTE AT 10TH VISIT.  KX MODIFIER AFTER 15 VISITS.    PT Start Time 1512    PT Stop Time 1555    PT Time Calculation (min) 43 min    Activity Tolerance Patient tolerated treatment well    Behavior During Therapy WFL for tasks assessed/performed             Past Medical History:  Diagnosis Date   Cancer ([place])    SKIN CANCER   GERD (gastroesophageal reflux disease)    History of skin cancer    left ear   Hyperlipidemia    Hypertension    Sinus congestion     Past Surgical History:  Procedure Laterality Date   DEBRIDEMENT TENNIS ELBOW     arthroscopic surg left arm   ROTATOR CUFF REPAIR     right   SHOULDER SURGERY  2007   torn ligament left shoulder    There were no vitals filed for this visit.   Subjective Assessment - 12/23/20 1516     Subjective COVID-19 screen performed prior to patient entering clinic. Report soreness upon arrival.    Pertinent History Left elbow surgery, right TKA, right RTC repair, HTN, cochlear implant.    How long can you walk comfortably? Around home without assisitve device.    Patient Stated Goals Get out of pain and do more.    Currently in Pain? Yes    Pain Location Knee    Pain Orientation Left    Pain Descriptors / Indicators Sore    Pain Type Surgical pain    Pain Onset More than a month ago    Pain Frequency Intermittent                [facility] PT Assessment - 12/23/20 0001       Assessment   Medical Diagnosis Left  total knee replacement.    Referring Provider (PT) Frazier Butt MD    Onset Date/Surgical Date 11/12/20    Next MD Visit 12/27/2020      Precautions   Precaution Comments No ultrasound.                           [facility] Adult PT Treatment/Exercise - 12/23/20 0001       Knee/Hip Exercises: Aerobic   Recumbent Bike L3, seat 3 x15 min      Knee/Hip Exercises: Machines for Strengthening   Cybex Knee Extension 10# 3x10 reps   eccentric lowering   Cybex Knee Flexion 30# 3x10 reps   eccentric lowering   Cybex Leg Press 2 pl, seat 5 x30 reps   eccentric LLE     Knee/Hip Exercises: Standing   Terminal Knee Extension Strengthening;Left;2 sets;10 reps;Limitations    Terminal Knee Extension Limitations Orange XTS    Lateral Step Up Left;20 reps;Hand Hold: 2;Step Height: 6"    Step Down Left;20 reps;Hand Hold: 2;Step Height: 6"    Walking with Sports  Cord Backwards orange XTS x10 reps      Modalities   Modalities Vasopneumatic      Vasopneumatic   Number Minutes Vasopneumatic  10 minutes    Vasopnuematic Location  Knee    Vasopneumatic Pressure Medium    Vasopneumatic Temperature  34/edema                       PT Short Term Goals - 12/09/20 1022       PT SHORT TERM GOAL #1   Title Independent with an initial HEP.    Time 2    Period Weeks    Status On-going      PT SHORT TERM GOAL #2   Title Full active left knee extension in order to normalize gait.    Time 2    Period Weeks    Status On-going               PT Long Term Goals - 12/09/20 1030       PT LONG TERM GOAL #1   Title Independent with an advanced HEP.    Time 6    Period Weeks    Status On-going      PT LONG TERM GOAL #2   Title Active left knee flexion to 125 degrees+ so the patient can perform functional tasks and do so with pain not > 2-3/10.    Time 6    Period Weeks    Status Achieved      PT LONG TERM GOAL #3   Title Increase left hip and knee strength  to a solid 5/5 to provide good stability for accomplishment of functional activities.    Time 6    Period Weeks    Status On-going      PT LONG TERM GOAL #4   Title Decrease edema to within 2 cms of non-affected side to assist with pain reduction and range of motion gains.    Time 6    Period Weeks    Status On-going      PT LONG TERM GOAL #5   Title Walk one mile with left knee pain not > 2/10.    Time 6    Period Weeks    Status On-going                   Plan - 12/23/20 1600     Clinical Impression Statement Patient presented in clinic in some fatigue already as he had already walked 1.5 mi prior to PT and time change. Patient able to complete all eccentric related strengthening exercises with only reports of fatigue. Minimal edema notable surrounding the L knee. Normal vasopneumatic response noted following removal of the modality. Patient does have a spot of white in incision that sticks out of the skin. Patient encouraged t show MD at next visit but not to pull on it until the MD sees the spot.    Personal Factors and Comorbidities Comorbidity 1    Comorbidities Left elbow surgery, right TKA, right RTC repair, HTN, cochlear implant.    Examination-Activity Limitations Other    Examination-Participation Restrictions Other    Stability/Clinical Decision Making Stable/Uncomplicated    Rehab Potential Excellent    PT Frequency 2x / week    PT Duration 6 weeks    PT Treatment/Interventions ADLs/Self Care Home Management;Cryotherapy;Electrical Stimulation;Moist Heat;Neuromuscular re-education;Therapeutic exercise;Therapeutic activities;Functional mobility training;Stair training;Patient/family education;Manual techniques;Passive range of motion;Scar mobilization;Vasopneumatic Device    PT Next Visit Plan Recumbent  bike, VMS to left quadriceps during short arc quads, PROM, PRE's, vasopneumatic.    Consulted and Agree with Plan of Care Patient             Patient will  benefit from skilled therapeutic intervention in order to improve the following deficits and impairments:  Pain, Decreased activity tolerance, Decreased strength, Increased edema, Decreased range of motion  Visit Diagnosis: Stiffness of left knee, not elsewhere classified  Chronic pain of left knee  Localized edema     Problem List Patient Active Problem List   Diagnosis Date Noted   Prostate cancer ([place]) 04/05/2019   Nocturia 04/05/2019   Benign prostatic hyperplasia with urinary obstruction 04/05/2019   Erectile dysfunction due to arterial insufficiency 04/05/2019    Standley Brooking, PTA 12/23/2020, 4:09 PM  [facility] [address] [place], [place], [address] Phone: [phone]   Fax:  [phone]  Name: Vincent Obrien MRN: [id number] Date of Birth: [birth year]

## 2020-12-26 ENCOUNTER — Ambulatory Visit: Payer: Medicare Other | Admitting: *Deleted

## 2020-12-26 ENCOUNTER — Other Ambulatory Visit: Payer: Self-pay

## 2020-12-26 DIAGNOSIS — M25662 Stiffness of left knee, not elsewhere classified: Secondary | ICD-10-CM | POA: Diagnosis not present

## 2020-12-26 DIAGNOSIS — M25562 Pain in left knee: Secondary | ICD-10-CM

## 2020-12-26 DIAGNOSIS — R600 Localized edema: Secondary | ICD-10-CM

## 2020-12-26 DIAGNOSIS — G8929 Other chronic pain: Secondary | ICD-10-CM

## 2020-12-26 NOTE — Therapy (Signed)
[facility] [place], [place], [address] Phone: [phone]   Fax:  [phone]  Physical Therapy Treatment  Patient Details  Name: Vincent Obrien MRN: [id number] Date of Birth: October 06, 1940 Referring Provider (PT): Frazier Butt MD   Encounter Date: 12/26/2020   PT End of Session - 12/26/20 1618     Visit Number 9    Number of Visits 12    Authorization Type FOTO AT LEAST EVERY 5TH VISIT.  PROGRESS NOTE AT 10TH VISIT.  KX MODIFIER AFTER 15 VISITS.    PT Start Time 1600    PT Stop Time 1635    PT Time Calculation (min) 35 min             Past Medical History:  Diagnosis Date   Cancer ([place])    SKIN CANCER   GERD (gastroesophageal reflux disease)    History of skin cancer    left ear   Hyperlipidemia    Hypertension    Sinus congestion     Past Surgical History:  Procedure Laterality Date   DEBRIDEMENT TENNIS ELBOW     arthroscopic surg left arm   ROTATOR CUFF REPAIR     right   SHOULDER SURGERY  2007   torn ligament left shoulder    There were no vitals filed for this visit.   Subjective Assessment - 12/26/20 1620     Subjective COVID-19 screen performed prior to patient entering clinic. Report soreness upon arrival L KN[place]    Pertinent History Left elbow surgery, right TKA, right RTC repair, HTN, cochlear implant.    How long can you walk comfortably? Around home without assisitve device.    Patient Stated Goals Get out of pain and do more.    Currently in Pain? Yes    Pain Location Knee    Pain Orientation Left    Pain Descriptors / Indicators Sore    Pain Type Surgical pain    Pain Onset More than a month ago                               [facility] Adult PT Treatment/Exercise - 12/26/20 0001       Knee/Hip Exercises: Aerobic   Recumbent Bike L3, seat 3 x15 min      Knee/Hip Exercises: Machines for Strengthening   Cybex Knee Extension 10# 3x10 reps   eccentric lowering    Cybex Knee Flexion 30# 3x10 reps   eccentric lowering     Knee/Hip Exercises: Standing   Terminal Knee Extension Strengthening;Left;2 sets;10 reps;Limitations    Lateral Step Up Left;20 reps;Hand Hold: 2;Step Height: 6"    Forward Step Up Left;2 sets;10 reps;Hand Hold: 2;Step Height: 6"              Reviwed HEP.            PT Short Term Goals - 12/26/20 1714       PT SHORT TERM GOAL #1   Title Independent with an initial HEP.    Time 2    Period Weeks    Status Achieved      PT SHORT TERM GOAL #2   Title Full active left knee extension in order to normalize gait.    Time 2    Period Weeks    Status Achieved               PT Long Term Goals - 12/26/20 1623  PT LONG TERM GOAL #1   Title Independent with an advanced HEP.    Time 6    Period Weeks    Status Achieved      PT LONG TERM GOAL #2   Title Active left knee flexion to 125 degrees+ so the patient can perform functional tasks and do so with pain not > 2-3/10.    Period Weeks    Status Achieved      PT LONG TERM GOAL #3   Title Increase left hip and knee strength to a solid 5/5 to provide good stability for accomplishment of functional activities.    Time 6    Period Weeks    Status Achieved      PT LONG TERM GOAL #4   Title Decrease edema to within 2 cms of non-affected side to assist with pain reduction and range of motion gains.    Time 6    Period Weeks    Status Achieved      PT LONG TERM GOAL #5   Title Walk one mile with left knee pain not > 2/10.    Time 6    Period Weeks    Status Achieved                   Plan - 12/26/20 1702     Personal Factors and Comorbidities Comorbidity 1    Comorbidities Left elbow surgery, right TKA, right RTC repair, HTN, cochlear implant.    Examination-Participation Restrictions Other    Stability/Clinical Decision Making Stable/Uncomplicated    Rehab Potential Excellent    PT Frequency 2x / week    PT Duration 6 weeks     PT Treatment/Interventions ADLs/Self Care Home Management;Cryotherapy;Electrical Stimulation;Moist Heat;Neuromuscular re-education;Therapeutic exercise;Therapeutic activities;Functional mobility training;Stair training;Patient/family education;Manual techniques;Passive range of motion;Scar mobilization;Vasopneumatic Device    PT Next Visit Plan Recumbent bike, VMS to left quadriceps during short arc quads, PROM, PRE's, vasopneumatic.    Consulted and Agree with Plan of Care Patient             Patient will benefit from skilled therapeutic intervention in order to improve the following deficits and impairments:  Pain, Decreased activity tolerance, Decreased strength, Increased edema, Decreased range of motion  Visit Diagnosis: Stiffness of left knee, not elsewhere classified  Chronic pain of left knee  Localized edema     Problem List Patient Active Problem List   Diagnosis Date Noted   Prostate cancer ([place]) 04/05/2019   Nocturia 04/05/2019   Benign prostatic hyperplasia with urinary obstruction 04/05/2019   Erectile dysfunction due to arterial insufficiency 04/05/2019    Eleny Cortez,CHRIS, PTA 12/26/2020, [facility] [address] [place], [place], [address] Phone: [phone]   Fax:  [phone]  Name: Vincent Obrien MRN: [id number] Date of Birth: Feb 15, 1941   PHYSICAL THERAPY DISCHARGE SUMMARY  Visits from Start of Care: 9.  Current functional level related to goals / functional outcomes: See above.    Remaining deficits: All goals met.   Education / Equipment: HEP.   Patient agrees to discharge. Patient goals were met. Patient is being discharged due to meeting the stated rehab goals.    [name] Applegate MPT

## 2021-03-03 ENCOUNTER — Other Ambulatory Visit: Payer: Self-pay | Admitting: Urology

## 2021-03-03 DIAGNOSIS — N138 Other obstructive and reflux uropathy: Secondary | ICD-10-CM

## 2021-05-05 ENCOUNTER — Other Ambulatory Visit: Payer: PRIVATE HEALTH INSURANCE

## 2021-05-06 ENCOUNTER — Other Ambulatory Visit: Payer: Self-pay

## 2021-05-06 ENCOUNTER — Other Ambulatory Visit: Payer: Medicare Other

## 2021-05-06 DIAGNOSIS — C61 Malignant neoplasm of prostate: Secondary | ICD-10-CM

## 2021-05-07 LAB — PSA: Prostate Specific Ag, Serum: 0.7 ng/mL (ref 0.0–4.0)

## 2021-05-12 ENCOUNTER — Ambulatory Visit (INDEPENDENT_AMBULATORY_CARE_PROVIDER_SITE_OTHER): Payer: Medicare Other | Admitting: Urology

## 2021-05-12 ENCOUNTER — Other Ambulatory Visit: Payer: Self-pay

## 2021-05-12 VITALS — BP 189/82 | HR 78

## 2021-05-12 DIAGNOSIS — N401 Enlarged prostate with lower urinary tract symptoms: Secondary | ICD-10-CM | POA: Diagnosis not present

## 2021-05-12 DIAGNOSIS — N5201 Erectile dysfunction due to arterial insufficiency: Secondary | ICD-10-CM | POA: Diagnosis not present

## 2021-05-12 DIAGNOSIS — R351 Nocturia: Secondary | ICD-10-CM

## 2021-05-12 DIAGNOSIS — C61 Malignant neoplasm of prostate: Secondary | ICD-10-CM | POA: Diagnosis not present

## 2021-05-12 DIAGNOSIS — N138 Other obstructive and reflux uropathy: Secondary | ICD-10-CM

## 2021-05-12 LAB — URINALYSIS, ROUTINE W REFLEX MICROSCOPIC
Bilirubin, UA: NEGATIVE
Glucose, UA: NEGATIVE
Leukocytes,UA: NEGATIVE
Nitrite, UA: NEGATIVE
RBC, UA: NEGATIVE
Specific Gravity, UA: 1.025 (ref 1.005–1.030)
Urobilinogen, Ur: 0.2 mg/dL (ref 0.2–1.0)
pH, UA: 6 (ref 5.0–7.5)

## 2021-05-12 LAB — MICROSCOPIC EXAMINATION
Epithelial Cells (non renal): NONE SEEN /hpf (ref 0–10)
RBC, Urine: NONE SEEN /hpf (ref 0–2)
Renal Epithel, UA: NONE SEEN /hpf
WBC, UA: NONE SEEN /hpf (ref 0–5)

## 2021-05-12 MED ORDER — TADALAFIL 20 MG PO TABS: 20.0000 mg | ORAL_TABLET | Freq: Every day | ORAL | 5 refills | Status: DC | PRN

## 2021-05-12 MED ORDER — TAMSULOSIN HCL 0.4 MG PO CAPS: 0.4000 mg | ORAL_CAPSULE | Freq: Every day | ORAL | 3 refills | Status: DC

## 2021-05-12 NOTE — Patient Instructions (Signed)
Prostate Cancer °The prostate is a small gland that helps make semen. It is located below a man's bladder, in front of the rectum. Prostate cancer is when abnormal cells grow in this gland. °What are the causes? °The cause of this condition is not known. °What increases the risk? °Being age 82 or older. °Having a family history of prostate cancer. °Having a family history of cancer of the breasts or ovaries. °Having genes that are passed from parent to child (inherited). °Having Lynch syndrome. °African American men and men of African descent are diagnosed with prostate cancer at higher rates than other men. °What are the signs or symptoms? °Problems peeing (urinating). This may include: °A stream that is weak, or pee that stops and starts. °Trouble starting or stopping your pee. °Trouble emptying all of your pee. °Needing to pee more often, especially at night. °Blood in your pee or semen. °Pain in the: °Lower back. °Lower belly (abdomen). °Hips. °Trouble getting an erection. °Weakness or numbness in the legs or feet. °How is this treated? °Treatment for this condition depends on: °How much the cancer has spread. °Your age. °The kind of treatment you want. °Your health. °Treatments include: °Being watched. This is called observation. You will be tested from time to time, but you will not get treated. Tests are to make sure that the cancer is not growing. °Surgery. This may be done to: °Take out (remove) the prostate. °Freeze and kill cancer cells. °Radiation. This uses a strong beam of energy to kill cancer cells. °Chemotherapy. This uses medicines that stop cancer cells from increasing. This kills cancer cells and healthy cells. °Targeted therapy. This kills cancer cells only. Healthy cells are not affected. °Hormone treatment. This stops the body from making hormones that help the cancer cells grow. °Follow these instructions at home: °Lifestyle °Do not smoke or use any products that contain nicotine or tobacco.  If you need help quitting, ask your doctor. °Eat a healthy diet. °Treatment may affect your ability to have sex. If you have a partner, touch, hold, hug, and caress your partner to have intimate moments. °Get plenty of sleep. °Ask your doctor for help to find a support group for men with prostate cancer. °General instructions °Take over-the-counter and prescription medicines only as told by your doctor. °If you have to go to the hospital, let your cancer doctor (oncologist) know. °Keep all follow-up visits. °Where to find more information °American Cancer Society: www.cancer.org °American Society of Clinical Oncology: www.cancer.net °National Cancer Institute: www.cancer.gov °Contact a doctor if: °You have new or more trouble peeing. °You have new or more blood in your pee. °You have new or more pain in your hips, back, or chest. °Get help right away if: °You have weakness in your legs. °You lose feeling in your legs. °You cannot control your pee or your poop (stool). °You have chills or a fever. °Summary °The prostate is a male gland that helps make semen. °Prostate cancer is when abnormal cells grow in this gland. °Treatment includes doing surgery, using medicines, using strong beams of energy, or watching without treatment. °Ask your doctor for help to find a support group for men with prostate cancer. °Contact a doctor if you have problems peeing or have any new pain that you did not have before. °This information is not intended to replace advice given to you by your health care provider. Make sure you discuss any questions you have with your health care provider. °Document Revised: 05/01/2020 Document Reviewed: 05/01/2020 °Elsevier   Patient Education © 2022 Elsevier Inc. ° °

## 2021-05-12 NOTE — Progress Notes (Signed)
? ?05/12/2021 ?9:40 AM  ? ?Vincent Obrien ?May 14, 1940 ?[id number] ? ?Referring provider: Chesley Noon, MD ?[address] ?[place],  [place] [address] ? ?Followup prostate cancer, BPH and erectile dysfunction ? ?HPI: ?[name] is a 81yo here for followup for prostate cancer, BPH with nocturia and erectile dysfunction. PSA 0.7. No worsening LUTS. IPSS 10 QOl 2 on flomax 0.'4mg'$  daily. Urine stream strong. No straining to urinate. Nocturia 1-2x. He continues to have issues with erectile dysfunction which are improved with tadalafil '20mg'$  prn.  ? ? ?PMH: ?Past Medical History:  ?Diagnosis Date  ? Cancer [facility])   ? SKIN CANCER  ? GERD (gastroesophageal reflux disease)   ? History of skin cancer   ? left ear  ? Hyperlipidemia   ? Hypertension   ? Sinus congestion   ? ? ?Surgical History: ?Past Surgical History:  ?Procedure Laterality Date  ? DEBRIDEMENT TENNIS ELBOW    ? arthroscopic surg left arm  ? ROTATOR CUFF REPAIR    ? right  ? SHOULDER SURGERY  2007  ? torn ligament left shoulder  ? ? ?Home Medications:  ?Allergies as of 05/12/2021   ? ?   Reactions  ? Oxycodone Nausea And Vomiting  ? SEVERE  ? ?  ? ?  ?Medication List  ?  ? ?  ? Accurate as of May 12, 2021  9:40 AM. If you have any questions, ask your nurse or doctor.  ?  ?  ? ?  ? ?aspirin 81 MG tablet ?Take 81 mg by mouth daily. ?  ?azelastine 0.1 % nasal spray ?Commonly known as: ASTELIN ?Place 2 sprays into both nostrils 2 (two) times daily. ?  ?dutasteride 0.5 MG capsule ?Commonly known as: AVODART ?Take 0.5 mg by mouth daily. ?  ?ipratropium 0.06 % nasal spray ?Commonly known as: ATROVENT ?Place 2 sprays into both nostrils 3 (three) times daily. ?  ?LIPITOR PO ?Take by mouth. ?  ?atorvastatin 20 MG tablet ?Commonly known as: LIPITOR ?Take 20 mg by mouth daily. ?  ?montelukast 10 MG tablet ?Commonly known as: SINGULAIR ?Take 10 mg by mouth daily. ?  ?omeprazole 20 MG capsule ?Commonly known as: PRILOSEC ?Take 20 mg by mouth daily. ?  ?omeprazole 40 MG  capsule ?Commonly known as: PRILOSEC ?Take 40 mg by mouth daily. ?  ?tadalafil 20 MG tablet ?Commonly known as: CIALIS ?Take 1 tablet (20 mg total) by mouth daily as needed for erectile dysfunction. ?  ?tamsulosin 0.4 MG Caps capsule ?Commonly known as: FLOMAX ?Take 1 capsule (0.4 mg total) by mouth daily. ?  ? ?  ? ? ?Allergies:  ?Allergies  ?Allergen Reactions  ? Oxycodone Nausea And Vomiting  ?  SEVERE ?  ? ? ?Family History: ?Family History  ?Problem Relation Age of Onset  ? Breast cancer Mother   ? Colon cancer Neg Hx   ? ? ?Social History:  reports that he has never smoked. He has never used smokeless tobacco. He reports that he does not drink alcohol and does not use drugs. ? ?ROS: ?All other review of systems were reviewed and are negative except what is noted above in HPI ? ?Physical Exam: ?BP (!) 189/82   Pulse 78   ?Constitutional:  Alert and oriented, No acute distress. ?HEENT: [place] AT, moist mucus membranes.  Trachea midline, no masses. ?Cardiovascular: No clubbing, cyanosis, or edema. ?Respiratory: Normal respiratory effort, no increased work of breathing. ?GI: Abdomen is soft, nontender, nondistended, no abdominal masses ?GU: No CVA tenderness.  ?  Lymph: No cervical or inguinal lymphadenopathy. ?Skin: No rashes, bruises or suspicious lesions. ?Neurologic: Grossly intact, no focal deficits, moving all 4 extremities. ?Psychiatric: Normal mood and affect. ? ?Laboratory Data: ?No results found for: WBC, HGB, HCT, MCV, PLT ? ?Lab Results  ?Component Value Date  ? CREATININE 1.40 (H) 09/07/2017  ? ? ?Lab Results  ?Component Value Date  ? PSA 0.4 04/03/2019  ? ? ?No results found for: TESTOSTERONE ? ?No results found for: HGBA1C ? ?Urinalysis ?   ?Component Value Date/Time  ? APPEARANCEUR Clear 05/13/2020 0920  ? GLUCOSEU Trace (A) 05/13/2020 0920  ? BILIRUBINUR Negative 05/13/2020 0920  ? PROTEINUR Trace (A) 05/13/2020 0920  ? NITRITE Negative 05/13/2020 0920  ? LEUKOCYTESUR Negative 05/13/2020 0920  ? ? ?Lab  Results  ?Component Value Date  ? LABMICR See below: 05/13/2020  ? Center Line None seen 05/13/2020  ? LABEPIT None seen 05/13/2020  ? MUCUS Present 05/13/2020  ? BACTERIA None seen 05/13/2020  ? ? ?Pertinent Imaging: ? ?No results found for this or any previous visit. ? ?No results found for this or any previous visit. ? ?No results found for this or any previous visit. ? ?No results found for this or any previous visit. ? ?No results found for this or any previous visit. ? ?No results found for this or any previous visit. ? ?No results found for this or any previous visit. ? ?No results found for this or any previous visit. ? ? ?Assessment & Plan:   ? ?1. Prostate cancer ([place]) ?RTC 1 year with PSA ?- Urinalysis, Routine w reflex microscopic ?- PSA; Future ? ?2. Benign prostatic hyperplasia with urinary obstruction ?Continue flomax 0.'4mg'$  daily ?- Urinalysis, Routine w reflex microscopic ?- tamsulosin (FLOMAX) 0.4 MG CAPS capsule; Take 1 capsule (0.4 mg total) by mouth daily.  Dispense: 90 capsule; Refill: 3 ? ?3. Nocturia ?-continue flomax 0.'4mg'$  daily ? ?4. Erectile dysfunction ?-refill tadalafil '20mg'$   ? ? ?Return in about 1 year (around 05/13/2022) for psa. ? ?Nicolette Bang, MD ? ?[facility] ?  ?

## 2021-05-15 ENCOUNTER — Encounter: Payer: Self-pay | Admitting: Urology

## 2022-05-06 ENCOUNTER — Other Ambulatory Visit: Payer: Medicare Other

## 2022-05-15 ENCOUNTER — Ambulatory Visit: Payer: Medicare Other | Admitting: Urology

## 2022-05-27 ENCOUNTER — Other Ambulatory Visit: Payer: Self-pay

## 2022-05-27 ENCOUNTER — Other Ambulatory Visit: Payer: Medicare Other

## 2022-05-27 ENCOUNTER — Telehealth: Payer: Self-pay

## 2022-05-27 DIAGNOSIS — C61 Malignant neoplasm of prostate: Secondary | ICD-10-CM

## 2022-05-27 NOTE — Telephone Encounter (Signed)
Patient cancelled labs today. Recently had PSA checked by his PCP. Results are in chart.

## 2022-05-30 ENCOUNTER — Other Ambulatory Visit: Payer: Self-pay | Admitting: Urology

## 2022-05-30 DIAGNOSIS — R351 Nocturia: Secondary | ICD-10-CM

## 2022-05-30 DIAGNOSIS — N401 Enlarged prostate with lower urinary tract symptoms: Secondary | ICD-10-CM

## 2022-06-05 ENCOUNTER — Ambulatory Visit: Payer: Medicare Other | Admitting: Urology

## 2022-06-05 VITALS — BP 201/74 | HR 67

## 2022-06-05 DIAGNOSIS — N401 Enlarged prostate with lower urinary tract symptoms: Secondary | ICD-10-CM

## 2022-06-05 DIAGNOSIS — N5201 Erectile dysfunction due to arterial insufficiency: Secondary | ICD-10-CM | POA: Diagnosis not present

## 2022-06-05 DIAGNOSIS — C61 Malignant neoplasm of prostate: Secondary | ICD-10-CM | POA: Diagnosis not present

## 2022-06-05 DIAGNOSIS — N138 Other obstructive and reflux uropathy: Secondary | ICD-10-CM

## 2022-06-05 DIAGNOSIS — R351 Nocturia: Secondary | ICD-10-CM | POA: Diagnosis not present

## 2022-06-05 LAB — URINALYSIS, ROUTINE W REFLEX MICROSCOPIC
Bilirubin, UA: NEGATIVE
Ketones, UA: NEGATIVE
Leukocytes,UA: NEGATIVE
Nitrite, UA: NEGATIVE
Protein,UA: NEGATIVE
RBC, UA: NEGATIVE
Specific Gravity, UA: 1.015 (ref 1.005–1.030)
Urobilinogen, Ur: 0.2 mg/dL (ref 0.2–1.0)
pH, UA: 5.5 (ref 5.0–7.5)

## 2022-06-05 MED ORDER — TAMSULOSIN HCL 0.4 MG PO CAPS: 0.4000 mg | ORAL_CAPSULE | Freq: Two times a day (BID) | ORAL | 11 refills | Status: AC

## 2022-06-05 MED ORDER — SILDENAFIL CITRATE 100 MG PO TABS: 100.0000 mg | ORAL_TABLET | Freq: Every day | ORAL | 5 refills | Status: AC | PRN

## 2022-06-05 NOTE — Progress Notes (Unsigned)
06/05/2022 11:40 AM   Vincent Obrien [birth year] [id number]  Referring provider: Eartha Inch, MD [address] [place],  [place] [address]  No chief complaint on file.   HPI: PSA stable at 0.9. IPSS 6 QOL 1 on flomax 0.4mg . He has dribbling which bothers him. He takes tadalafil with poor results   PMH: Past Medical History:  Diagnosis Date   Cancer (HCC)    SKIN CANCER   GERD (gastroesophageal reflux disease)    History of skin cancer    left ear   Hyperlipidemia    Hypertension    Sinus congestion     Surgical History: Past Surgical History:  Procedure Laterality Date   DEBRIDEMENT TENNIS ELBOW     arthroscopic surg left arm   ROTATOR CUFF REPAIR     right   SHOULDER SURGERY  2007   torn ligament left shoulder    Home Medications:  Allergies as of 06/05/2022       Reactions   Oxycodone Nausea And Vomiting   SEVERE        Medication List        Accurate as of June 05, 2022 11:40 AM. If you have any questions, ask your nurse or doctor.          aspirin 81 MG tablet Take 81 mg by mouth daily.   azelastine 0.1 % nasal spray Commonly known as: ASTELIN Place 2 sprays into both nostrils 2 (two) times daily.   dutasteride 0.5 MG capsule Commonly known as: AVODART Take 0.5 mg by mouth daily.   ipratropium 0.06 % nasal spray Commonly known as: ATROVENT Place 2 sprays into both nostrils 3 (three) times daily.   LIPITOR PO Take by mouth.   atorvastatin 20 MG tablet Commonly known as: LIPITOR Take 20 mg by mouth daily.   montelukast 10 MG tablet Commonly known as: SINGULAIR Take 10 mg by mouth daily.   omeprazole 20 MG capsule Commonly known as: PRILOSEC Take 20 mg by mouth daily.   omeprazole 40 MG capsule Commonly known as: PRILOSEC Take 40 mg by mouth daily.   tadalafil 20 MG tablet Commonly known as: CIALIS Take 1 tablet (20 mg total) by mouth daily as needed for erectile dysfunction.   tamsulosin 0.4 MG Caps  capsule Commonly known as: FLOMAX Take 1 capsule by mouth once daily        Allergies:  Allergies  Allergen Reactions   Oxycodone Nausea And Vomiting    SEVERE     Family History: Family History  Problem Relation Age of Onset   Breast cancer Mother    Colon cancer Neg Hx     Social History:  reports that he has never smoked. He has never used smokeless tobacco. He reports that he does not drink alcohol and does not use drugs.  ROS: All other review of systems were reviewed and are negative except what is noted above in HPI  Physical Exam: Pulse 67   Constitutional:  Alert and oriented, No acute distress. HEENT: [place] AT, moist mucus membranes.  Trachea midline, no masses. Cardiovascular: No clubbing, cyanosis, or edema. Respiratory: Normal respiratory effort, no increased work of breathing. GI: Abdomen is soft, nontender, nondistended, no abdominal masses GU: No CVA tenderness.  Lymph: No cervical or inguinal lymphadenopathy. Skin: No rashes, bruises or suspicious lesions. Neurologic: Grossly intact, no focal deficits, moving all 4 extremities. Psychiatric: Normal mood and affect.  Laboratory Data: No results found for: "WBC", "HGB", "HCT", "  MCV", "PLT"  Lab Results  Component Value Date   CREATININE 1.40 (H) 09/07/2017    Lab Results  Component Value Date   PSA 0.4 04/03/2019    No results found for: "TESTOSTERONE"  No results found for: "HGBA1C"  Urinalysis    Component Value Date/Time   APPEARANCEUR Clear 05/12/2021 0936   GLUCOSEU Negative 05/12/2021 0936   BILIRUBINUR Negative 05/12/2021 0936   PROTEINUR Trace (A) 05/12/2021 0936   NITRITE Negative 05/12/2021 0936   LEUKOCYTESUR Negative 05/12/2021 0936    Lab Results  Component Value Date   LABMICR See below: 05/12/2021   WBCUA None seen 05/12/2021   LABEPIT None seen 05/12/2021   MUCUS Present 05/12/2021   BACTERIA Few 05/12/2021    Pertinent Imaging: *** No results found for this or  any previous visit.  No results found for this or any previous visit.  No results found for this or any previous visit.  No results found for this or any previous visit.  No results found for this or any previous visit.  No valid procedures specified. No results found for this or any previous visit.  No results found for this or any previous visit.   Assessment & Plan:    1. Prostate cancer ***  2. Benign prostatic hyperplasia with urinary obstruction ***  3. Nocturia ***  4. Erectile dysfunction due to arterial insufficiency ***   No follow-ups on file.  Wilkie Aye, MD  [facility] [place]

## 2022-06-09 ENCOUNTER — Encounter: Payer: Self-pay | Admitting: Urology

## 2022-06-09 NOTE — Patient Instructions (Signed)

## 2022-10-29 ENCOUNTER — Encounter: Payer: Self-pay | Admitting: Urology

## 2022-10-30 ENCOUNTER — Other Ambulatory Visit: Payer: Self-pay

## 2022-10-30 MED ORDER — SILODOSIN 8 MG PO CAPS: 8.0000 mg | ORAL_CAPSULE | Freq: Every day | ORAL | 11 refills | Status: DC

## 2023-03-02 DIAGNOSIS — J302 Other seasonal allergic rhinitis: Secondary | ICD-10-CM | POA: Insufficient documentation

## 2023-04-14 ENCOUNTER — Ambulatory Visit: Payer: Medicare Other | Admitting: Urology

## 2023-04-14 ENCOUNTER — Encounter: Payer: Self-pay | Admitting: Urology

## 2023-04-14 VITALS — BP 188/75 | HR 69 | Temp 98.5°F

## 2023-04-14 DIAGNOSIS — L723 Sebaceous cyst: Secondary | ICD-10-CM | POA: Insufficient documentation

## 2023-04-14 DIAGNOSIS — N492 Inflammatory disorders of scrotum: Secondary | ICD-10-CM | POA: Diagnosis not present

## 2023-04-14 LAB — URINALYSIS, ROUTINE W REFLEX MICROSCOPIC
Bilirubin, UA: NEGATIVE
Glucose, UA: NEGATIVE
Ketones, UA: NEGATIVE
Leukocytes,UA: NEGATIVE
Nitrite, UA: NEGATIVE
Protein,UA: NEGATIVE
RBC, UA: NEGATIVE
Specific Gravity, UA: 1.01 (ref 1.005–1.030)
Urobilinogen, Ur: 0.2 mg/dL (ref 0.2–1.0)
pH, UA: 6 (ref 5.0–7.5)

## 2023-04-14 MED ORDER — SULFAMETHOXAZOLE-TRIMETHOPRIM 800-160 MG PO TABS: 1.0000 | ORAL_TABLET | Freq: Two times a day (BID) | ORAL | 0 refills | Status: AC

## 2023-04-14 NOTE — Progress Notes (Signed)
 Name: Vincent Obrien DOB: Jul 02, 1940 MRN: [id number]  History of Present Illness: Vincent Obrien is a 83 y.o. male who presents today at [facility] [place].  - GU history: 1. Prostate cancer. - 02/26/2016: Biopsy revealed Gleason 4+3=7 in 1/12 cores, Gleason 3+4=7 in 1/12 cores and Gleason 3+3=6 in 1/12 cores on the right base. - Treated with IMRT in early 2018 then did ADT x6 months. 2. BPH with LUTS (nocturia, dribbling stream). 3. Erectile dysfunction. - Previously took Cialis (Tadalafil) 20 mg PRN; ineffective.  PSA values: - 04/03/2019: 0.4 - 03/01/2019: 0.6 - 10/12/2019: 0.7 - 04/01/2020: 0.8 - 04/22/2020: 0.8 - 05/06/2021: 0.7 - 04/27/2022: 0.9  At last visit with Vincent Obrien on 06/05/2022: - For BPH: Flomax increased to 0.4 mg twice daily. - For ED: Tadalafil ineffective. Switched to Sildenafil 100 mg PRN. - For prostate cancer: PSA stable. Advised follow up in 1 year with PSA.  Today: He reports left scrotal "cyst" which has been present for >5 years. He noticed 2 days ago that it is significantly increased in size and had developed "a head on it and bled a little bit from there then quit." Denies drainage at this time. He denies accompanying pain, tenderness, redness, or warmth. Denies any known precipitating factors / recent injury to that area. Denies fever.   Reports terminal dribbling and nocturia x2; otherwise denies any urinary complaints.    Fall Screening: Do you usually have a device to assist in your mobility? No   Medications: Current Outpatient Medications  Medication Sig Dispense Refill   atorvastatin (LIPITOR) 20 MG tablet Take 20 mg by mouth daily.     Atorvastatin Calcium (LIPITOR PO) Take by mouth.     azelastine (ASTELIN) 0.1 % nasal spray Place 2 sprays into both nostrils 2 (two) times daily.     montelukast (SINGULAIR) 10 MG tablet Take 10 mg by mouth daily.     sildenafil (VIAGRA) 100 MG tablet Take 1 tablet (100 mg total) by mouth  daily as needed for erectile dysfunction. 10 tablet 5   silodosin (RAPAFLO) 8 MG CAPS capsule Take 1 capsule (8 mg total) by mouth daily with breakfast. 30 capsule 11   sulfamethoxazole-trimethoprim (BACTRIM DS) 800-160 MG tablet Take 1 tablet by mouth every 12 (twelve) hours. 14 tablet 0   tamsulosin (FLOMAX) 0.4 MG CAPS capsule Take 1 capsule (0.4 mg total) by mouth 2 (two) times daily. 60 capsule 11   aspirin 81 MG tablet Take 81 mg by mouth daily. (Patient not taking: Reported on 04/14/2023)     dutasteride (AVODART) 0.5 MG capsule Take 0.5 mg by mouth daily. (Patient not taking: Reported on 04/14/2023)     ipratropium (ATROVENT) 0.06 % nasal spray Place 2 sprays into both nostrils 3 (three) times daily. (Patient not taking: Reported on 04/14/2023)     omeprazole (PRILOSEC) 20 MG capsule Take 20 mg by mouth daily. (Patient not taking: Reported on 04/14/2023)     No current facility-administered medications for this visit.    Allergies: Allergies  Allergen Reactions   Oxycodone Nausea And Vomiting    SEVERE     Past Medical History:  Diagnosis Date   Cancer (HCC)    SKIN CANCER   GERD (gastroesophageal reflux disease)    History of skin cancer    left ear   Hyperlipidemia    Hypertension    Sinus congestion    Past Surgical History:  Procedure Laterality Date   DEBRIDEMENT TENNIS ELBOW  arthroscopic surg left arm   ROTATOR CUFF REPAIR     right   SHOULDER SURGERY  2007   torn ligament left shoulder   Family History  Problem Relation Age of Onset   Breast cancer Mother    Colon cancer Neg Hx    Social History   Socioeconomic History   Marital status: Married    Spouse name: Not on file   Number of children: Not on file   Years of education: Not on file   Highest education level: Not on file  Occupational History   Not on file  Tobacco Use   Smoking status: Never   Smokeless tobacco: Never  Substance and Sexual Activity   Alcohol use: No   Drug use: No    Sexual activity: Not on file  Other Topics Concern   Not on file  Social History Narrative   Not on file   Social Drivers of Health   Financial Resource Strain: Low Risk  (03/02/2023)   Received from [facility]   Overall Financial Resource Strain (CARDIA)    Difficulty of Paying Living Expenses: Not hard at all  Food Insecurity: No Food Insecurity (03/02/2023)   Received from [facility]   Hunger Vital Sign    Worried About Running Out of Food in the Last Year: Never true    Ran Out of Food in the Last Year: Never true  Transportation Needs: No Transportation Needs (03/02/2023)   Received from [facility] - Transportation    Lack of Transportation (Medical): No    Lack of Transportation (Non-Medical): No  Physical Activity: Unknown (04/24/2022)   Received from [facility]   Exercise Vital Sign    Days of Exercise per Week: Patient declined    Minutes of Exercise per Session: Not on file  Stress: Patient Declined (04/24/2022)   Received from [facility] - Occupational Stress Questionnaire    Feeling of Stress : Patient declined  Social Connections: Unknown (10/27/2022)   Received from [facility]   Social Network    Social Network: Not on file  Intimate Partner Violence: Unknown (05/20/2021)   [facility] from [facility]   HITS    Physically Hurt: Not on file    Insult or Talk Down To: Not on file    Threaten Physical Harm: Not on file    Scream or Curse: Not on file    Review of Systems Constitutional: Patient denies any unintentional weight loss or change in strength lntegumentary: Patient denies any rashes or pruritus Cardiovascular: Patient denies chest pain or syncope Respiratory: Patient denies shortness of breath Gastrointestinal: Patient denies nausea, vomiting, constipation, or diarrhea Musculoskeletal: Patient denies muscle cramps or weakness Neurologic: Patient denies  convulsions or seizures Allergic/Immunologic: Patient denies recent allergic reaction(s) Hematologic/Lymphatic: Patient denies bleeding tendencies Endocrine: Patient denies heat/cold intolerance  GU: As per HPI.  OBJECTIVE Vitals:   04/14/23 1134  BP: (!) 188/75  Pulse: 69  Temp: 98.5 F (36.9 C)   There is no height or weight on file to calculate BMI.  Physical Examination Constitutional: No obvious distress; patient is non-toxic appearing  Cardiovascular: No visible lower extremity edema.  Respiratory: The patient does not have audible wheezing/stridor; respirations do not appear labored  Gastrointestinal: Abdomen non-distended Musculoskeletal: Normal ROM of UEs  Skin: No obvious rashes/open sores  Neurologic: CN 2-12 grossly intact Psychiatric: Answered questions appropriately with normal affect  Hematologic/Lymphatic/Immunologic: No obvious  bruises or sites of spontaneous bleeding  Genitourinary: Penis is normal in appearance.  Scrotum is normal in appearance. Testes are normal in size and position bilaterally. Carbuncle with induration, mild erythema, and mild warmth at proximal left scrotum. Non-tender to palpation. No drainage noted at rest; unable to express any fluid or discharge with gentle pressure.  Chaperone offered for pelvic exam; patient declined.  UA: negative   ASSESSMENT Carbuncle of scrotum - Plan: Urinalysis, Routine w reflex microscopic, sulfamethoxazole-trimethoprim (BACTRIM DS) 800-160 MG tablet  We discussed finding of carbuncle on exam, which is a coalescence of several inflamed follicles into a single inflammatory mass from multiple follicles. Pt was advised not to manipulate the area or try to "pop" the carbuncle. He was instructed to do compresses with a wet warm washcloth with gentle firm pressure 3-4 times per day for 10 minutes at a time to promote reabsorption or drainage. Will treat with Bactrim and follow up in 2-3 weeks for recheck. Patient  verbalized understanding of and agreement with current plan. All questions were answered.  PLAN Advised the following: 1. Bactrim DS 2x/day x7 days. 2. Wet warm compresses as instructed. 3. Return in about 2 weeks (around 04/28/2023) for f/u with Evette Georges, NP.  Orders Placed This Encounter  Procedures   Urinalysis, Routine w reflex microscopic    It has been explained that the patient is to follow regularly with their PCP in addition to all other providers involved in their care and to follow instructions provided by these respective offices. Patient advised to contact urology clinic if any urologic-pertaining questions, concerns, new symptoms or problems arise in the interim period.  There are no Patient Instructions on file for this visit.  Electronically signed by:  Donnita Falls, FNP   04/14/23    12:21 PM

## 2023-05-03 NOTE — Progress Notes (Unsigned)
 Name: Vincent Obrien DOB: [birth year] MRN: [id number]  History of Present Illness: Vincent Obrien is a 83 y.o. male who presents today for follow up visit at [facility] [place].  - GU history: 1. Prostate cancer. - 02/26/2016: Biopsy revealed Gleason 4+3=7 in 1/12 cores, Gleason 3+4=7 in 1/12 cores and Gleason 3+3=6 in 1/12 cores on the right base. - Treated with IMRT in early 2018 then did ADT x6 months. 2. BPH with LUTS (nocturia, dribbling stream). - Taking Flomax 0.4 mg twice daily. 3. Erectile dysfunction. - Previously took Cialis (Tadalafil) 20 mg PRN; ineffective. - Taking Sildenafil 100 mg PRN.   PSA values: - 04/03/2019: 0.4 - 03/01/2019: 0.6 - 10/12/2019: 0.7 - 04/01/2020: 0.8 - 04/22/2020: 0.8 - 05/06/2021: 0.7 - 04/27/2022: 0.9   At last visit on 04/14/2023: - Reported left scrotal "cyst" which has been present for >5 years which significantly increased in size 2 days prior.  - Scrotal carbuncle noted on exam.  - Reported terminal dribbling and nocturia x2; otherwise no urinary complaints. - Advised the following: 1. Bactrim DS 2x/day x7 days. 2. Wet warm compresses with gentle firm pressure 3-4 times per day for 10 minutes at a time. 3. Return in about 2 weeks (around 04/28/2023) for f/u with Evette Georges, NP.  Today: He reports significant improvement since last visit. Reports that the left scrotal area of concern has decreased in size and denies pain, redness, warmth, tenderness, or fevers. Denies recent drainage or discharge there. He denies acute urinary complaints.   Medications: Current Outpatient Medications  Medication Sig Dispense Refill   aspirin 81 MG tablet Take 81 mg by mouth daily.     atorvastatin (LIPITOR) 20 MG tablet Take 20 mg by mouth daily.     Atorvastatin Calcium (LIPITOR PO) Take by mouth.     azelastine (ASTELIN) 0.1 % nasal spray Place 2 sprays into both nostrils 2 (two) times daily.     dutasteride (AVODART) 0.5 MG capsule Take  0.5 mg by mouth daily.     ipratropium (ATROVENT) 0.06 % nasal spray Place 2 sprays into both nostrils 3 (three) times daily.     montelukast (SINGULAIR) 10 MG tablet Take 10 mg by mouth daily.     omeprazole (PRILOSEC) 20 MG capsule Take 20 mg by mouth daily.     sildenafil (VIAGRA) 100 MG tablet Take 1 tablet (100 mg total) by mouth daily as needed for erectile dysfunction. 10 tablet 5   silodosin (RAPAFLO) 8 MG CAPS capsule Take 1 capsule (8 mg total) by mouth daily with breakfast. 30 capsule 11   sulfamethoxazole-trimethoprim (BACTRIM DS) 800-160 MG tablet Take 1 tablet by mouth every 12 (twelve) hours. 14 tablet 0   tamsulosin (FLOMAX) 0.4 MG CAPS capsule Take 1 capsule (0.4 mg total) by mouth 2 (two) times daily. 60 capsule 11   No current facility-administered medications for this visit.    Allergies: Allergies  Allergen Reactions   Oxycodone Nausea And Vomiting    SEVERE     Past Medical History:  Diagnosis Date   Cancer (HCC)    SKIN CANCER   GERD (gastroesophageal reflux disease)    History of skin cancer    left ear   Hyperlipidemia    Hypertension    Sinus congestion    Past Surgical History:  Procedure Laterality Date   DEBRIDEMENT TENNIS ELBOW     arthroscopic surg left arm   ROTATOR CUFF REPAIR     right   SHOULDER SURGERY  2007   torn ligament left shoulder   Family History  Problem Relation Age of Onset   Breast cancer Mother    Colon cancer Neg Hx    Social History   Socioeconomic History   Marital status: Married    Spouse name: Not on file   Number of children: Not on file   Years of education: Not on file   Highest education level: Not on file  Occupational History   Not on file  Tobacco Use   Smoking status: Never   Smokeless tobacco: Never  Substance and Sexual Activity   Alcohol use: No   Drug use: No   Sexual activity: Not on file  Other Topics Concern   Not on file  Social History Narrative   Not on file   Social Drivers of  Health   Financial Resource Strain: Low Risk  (03/02/2023)   Received from [facility]   Overall Financial Resource Strain (CARDIA)    Difficulty of Paying Living Expenses: Not hard at all  Food Insecurity: No Food Insecurity (03/02/2023)   Received from [facility]   Hunger Vital Sign    Worried About Running Out of Food in the Last Year: Never true    Ran Out of Food in the Last Year: Never true  Transportation Needs: No Transportation Needs (03/02/2023)   Received from [facility] - Transportation    Lack of Transportation (Medical): No    Lack of Transportation (Non-Medical): No  Physical Activity: Unknown (04/24/2022)   Received from [facility]   Exercise Vital Sign    Days of Exercise per Week: Patient declined    Minutes of Exercise per Session: Not on file  Stress: Patient Declined (04/24/2022)   Received from [facility] - Occupational Stress Questionnaire    Feeling of Stress : Patient declined  Social Connections: Unknown (10/27/2022)   Received [facility]   Social Network    Social Network: Not on file  Intimate Partner Violence: Unknown (05/20/2021)   Received from [facility]   HITS    Physically Hurt: Not on file    Insult or Talk Down To: Not on file    Threaten Physical Harm: Not on file    Scream or Curse: Not on file    Review of Systems Constitutional: Patient denies any unintentional weight loss or change in strength lntegumentary: Patient denies any rashes or pruritus Cardiovascular: Patient denies chest pain or syncope Respiratory: Patient denies shortness of breath Musculoskeletal: Patient denies muscle cramps or weakness Neurologic: Patient denies convulsions or seizures Allergic/Immunologic: Patient denies recent allergic reaction(s) Hematologic/Lymphatic: Patient denies bleeding tendencies Endocrine: Patient denies heat/cold intolerance  GU: As per  HPI.  OBJECTIVE Vitals:   05/04/23 1302  BP: (!) 178/80  Pulse: 68  Temp: 97.7 F (36.5 C)   There is no height or weight on file to calculate BMI.  Physical Examination Constitutional: No obvious distress; patient is non-toxic appearing  Cardiovascular: No visible lower extremity edema.  Respiratory: The patient does not have audible wheezing/stridor; respirations do not appear labored  Gastrointestinal: Abdomen non-distended Musculoskeletal: Normal ROM of UEs  Skin: No obvious rashes/open sores  Neurologic: CN 2-12 grossly intact Psychiatric: Answered questions appropriately with normal affect  Hematologic/Lymphatic/Immunologic: No obvious bruises or sites of spontaneous bleeding  Genitourinary: Penis is normal in appearance.  Carbuncle at proximal left posterior scrotum is significantly decreased in size compared to  prior with minimal induration. No edema, fluctuance, erythema, warmth, discharge, or tenderness to palpation.  Chaperone offered for pelvic exam; patient declined.  UA: negative  PVR: 20 ml  ASSESSMENT Carbuncle of scrotum - Plan: Urinalysis, Routine w reflex microscopic, BLADDER SCAN AMB NON-IMAGING  Benign prostatic hyperplasia with urinary obstruction   He is doing well. Advised to continue wet warm compresses and cleanse area gently with soap and water. Will plan for follow up as previously scheduled with Dr. Ronne Binning on 06/09/2023 for cancer surveillance or sooner if needed. Pt verbalized understanding and agreement. All questions were answered.  PLAN Advised the following: 1. Return in 5 weeks (on 06/09/2023) for as previously scheduled, f/u with Dr. Ronne Binning.  Orders Placed This Encounter  Procedures   Urinalysis, Routine w reflex microscopic   BLADDER SCAN AMB NON-IMAGING   It has been explained that the patient is to follow regularly with their PCP in addition to all other providers involved in their care and to follow instructions provided by  these respective offices. Patient advised to contact urology clinic if any urologic-pertaining questions, concerns, new symptoms or problems arise in the interim period.  There are no Patient Instructions on file for this visit.  Electronically signed by:  Donnita Falls, FNP   05/04/23    1:23 PM

## 2023-05-04 ENCOUNTER — Ambulatory Visit: Payer: Medicare Other | Admitting: Urology

## 2023-05-04 ENCOUNTER — Encounter: Payer: Self-pay | Admitting: Urology

## 2023-05-04 VITALS — BP 178/80 | HR 68 | Temp 97.7°F

## 2023-05-04 DIAGNOSIS — N492 Inflammatory disorders of scrotum: Secondary | ICD-10-CM

## 2023-05-04 DIAGNOSIS — N138 Other obstructive and reflux uropathy: Secondary | ICD-10-CM

## 2023-05-04 DIAGNOSIS — N401 Enlarged prostate with lower urinary tract symptoms: Secondary | ICD-10-CM

## 2023-05-04 LAB — URINALYSIS, ROUTINE W REFLEX MICROSCOPIC
Bilirubin, UA: NEGATIVE
Glucose, UA: NEGATIVE
Ketones, UA: NEGATIVE
Leukocytes,UA: NEGATIVE
Nitrite, UA: NEGATIVE
Protein,UA: NEGATIVE
RBC, UA: NEGATIVE
Specific Gravity, UA: 1.01 (ref 1.005–1.030)
Urobilinogen, Ur: 0.2 mg/dL (ref 0.2–1.0)
pH, UA: 6 (ref 5.0–7.5)

## 2023-05-04 LAB — BLADDER SCAN AMB NON-IMAGING: Scan Result: 20

## 2023-05-31 ENCOUNTER — Other Ambulatory Visit: Payer: Medicare Other

## 2023-06-03 ENCOUNTER — Other Ambulatory Visit

## 2023-06-04 LAB — PSA: Prostate Specific Ag, Serum: 1 ng/mL (ref 0.0–4.0)

## 2023-06-09 ENCOUNTER — Ambulatory Visit: Payer: Medicare Other | Admitting: Urology

## 2023-06-09 VITALS — BP 172/62 | HR 62

## 2023-06-09 DIAGNOSIS — R351 Nocturia: Secondary | ICD-10-CM | POA: Diagnosis not present

## 2023-06-09 DIAGNOSIS — N401 Enlarged prostate with lower urinary tract symptoms: Secondary | ICD-10-CM

## 2023-06-09 DIAGNOSIS — N138 Other obstructive and reflux uropathy: Secondary | ICD-10-CM

## 2023-06-09 DIAGNOSIS — C61 Malignant neoplasm of prostate: Secondary | ICD-10-CM

## 2023-06-09 LAB — URINALYSIS, ROUTINE W REFLEX MICROSCOPIC
Bilirubin, UA: NEGATIVE
Glucose, UA: NEGATIVE
Ketones, UA: NEGATIVE
Leukocytes,UA: NEGATIVE
Nitrite, UA: NEGATIVE
Protein,UA: NEGATIVE
RBC, UA: NEGATIVE
Specific Gravity, UA: <1.005 — ABNORMAL LOW (ref 1.005–1.030)
Urobilinogen, Ur: 0.2 mg/dL (ref 0.2–1.0)
pH, UA: 6 (ref 5.0–7.5)

## 2023-06-09 MED ORDER — SILODOSIN 8 MG PO CAPS: 8.0000 mg | ORAL_CAPSULE | Freq: Two times a day (BID) | ORAL | 11 refills | Status: AC

## 2023-06-09 NOTE — Progress Notes (Addendum)
 06/09/2023 1:34 PM   Vincent Obrien 31-Aug-1940 [id number]  Referring provider: Emaline Handsome, MD [address] [place],  [place] [address]  Followup prostate cancer   HPI: Vincent Obrien is a 82yo here for followup for prostate cancer and BPH. PSA increased to 1.0 from 0.7. IPSS 14 QOL 3 on rapaflo  and flomax  daily. He has bothersome post voiding dribbling. Uirne stream is strong. No straining to urinate. Nocturia 2-3x. Vincent Obrien   PMH: Past Medical History:  Diagnosis Date   Cancer (HCC)    SKIN CANCER   GERD (gastroesophageal reflux disease)    History of skin cancer    left ear   Hyperlipidemia    Hypertension    Sinus congestion     Surgical History: Past Surgical History:  Procedure Laterality Date   DEBRIDEMENT TENNIS ELBOW     arthroscopic surg left arm   ROTATOR CUFF REPAIR     right   SHOULDER SURGERY  2007   torn ligament left shoulder    Home Medications:  Allergies as of 06/09/2023       Reactions   Oxycodone Nausea And Vomiting   SEVERE        Medication List        Accurate as of June 09, 2023  1:34 PM. If you have any questions, ask your nurse or doctor.          aspirin 81 MG tablet Take 81 mg by mouth daily.   azelastine 0.1 % nasal spray Commonly known as: ASTELIN Place 2 sprays into both nostrils 2 (two) times daily.   dutasteride 0.5 MG capsule Commonly known as: AVODART Take 0.5 mg by mouth daily.   ipratropium 0.06 % nasal spray Commonly known as: ATROVENT Place 2 sprays into both nostrils 3 (three) times daily.   LIPITOR PO Take by mouth.   atorvastatin 20 MG tablet Commonly known as: LIPITOR Take 20 mg by mouth daily.   montelukast 10 MG tablet Commonly known as: SINGULAIR Take 10 mg by mouth daily.   omeprazole 20 MG capsule Commonly known as: PRILOSEC Take 20 mg by mouth daily.   sildenafil  100 MG tablet Commonly known as: VIAGRA  Take 1 tablet (100 mg total) by mouth daily as needed for erectile  dysfunction.   silodosin  8 MG Caps capsule Commonly known as: RAPAFLO  Take 1 capsule (8 mg total) by mouth daily with breakfast.   sulfamethoxazole -trimethoprim  800-160 MG tablet Commonly known as: BACTRIM  DS Take 1 tablet by mouth every 12 (twelve) hours.   tamsulosin  0.4 MG Caps capsule Commonly known as: FLOMAX  Take 1 capsule (0.4 mg total) by mouth 2 (two) times daily.        Allergies:  Allergies  Allergen Reactions   Oxycodone Nausea And Vomiting    SEVERE     Family History: Family History  Problem Relation Age of Onset   Breast cancer Mother    Colon cancer Neg Hx     Social History:  reports that he has never smoked. He has never used smokeless tobacco. He reports that he does not drink alcohol and does not use drugs.  ROS: All other review of systems were reviewed and are negative except what is noted above in HPI  Physical Exam: BP (!) 172/62   Pulse 62   Constitutional:  Alert and oriented, No acute distress. HEENT: Vincent Obrien AT, moist mucus membranes.  Trachea midline, no masses. Cardiovascular: No clubbing, cyanosis, or edema. Respiratory: Normal respiratory effort,  no increased work of breathing. GI: Abdomen is soft, nontender, nondistended, no abdominal masses GU: No CVA tenderness.  Lymph: No cervical or inguinal lymphadenopathy. Skin: No rashes, bruises or suspicious lesions. Neurologic: Grossly intact, no focal deficits, moving all 4 extremities. Psychiatric: Normal mood and affect.  Laboratory Data: No results found for: "WBC", "HGB", "HCT", "MCV", "PLT"  Lab Results  Component Value Date   CREATININE 1.40 (H) 09/07/2017    Lab Results  Component Value Date   PSA 0.4 04/03/2019    No results found for: "TESTOSTERONE"  No results found for: "HGBA1C"  Urinalysis    Component Value Date/Time   APPEARANCEUR Clear 05/04/2023 1254   GLUCOSEU Negative 05/04/2023 1254   BILIRUBINUR Negative 05/04/2023 1254   PROTEINUR Negative 05/04/2023  1254   NITRITE Negative 05/04/2023 1254   LEUKOCYTESUR Negative 05/04/2023 1254    Lab Results  Component Value Date   LABMICR Comment 05/04/2023   WBCUA None seen 05/12/2021   LABEPIT None seen 05/12/2021   MUCUS Present 05/12/2021   BACTERIA Few 05/12/2021    Pertinent Imaging:  No results found for this or any previous visit.  No results found for this or any previous visit.  No results found for this or any previous visit.  No results found for this or any previous visit.  No results found for this or any previous visit.  No results found for this or any previous visit.  No results found for this or any previous visit.  No results found for this or any previous visit.   Assessment & Plan:    1. Prostate cancer (HCC) (Primary) Followup 1 year with a psa - Urinalysis, Routine w reflex microscopic  2. Nocturia Rapaflo  8mg  BID  3. Benign prostatic hyperplasia with urinary obstruction Rapaflo  8mg  BID   No follow-ups on file.  Vincent Nailer, MD  [facility]

## 2023-06-10 ENCOUNTER — Telehealth: Payer: Self-pay

## 2023-06-10 NOTE — Telephone Encounter (Signed)
 Medication prior authorization request received.  Completed PA request through cover my meds for drug silodosin  8mg  BID. KEY: [id number]  Approved: Pending

## 2023-06-15 ENCOUNTER — Encounter: Payer: Self-pay | Admitting: Urology

## 2023-06-15 NOTE — Patient Instructions (Signed)

## 2023-07-02 ENCOUNTER — Encounter: Payer: Self-pay | Admitting: Urology

## 2023-07-09 NOTE — Progress Notes (Signed)
Letter faxed to appeals.

## 2024-01-04 ENCOUNTER — Encounter: Payer: Self-pay | Admitting: Urology

## 2024-01-05 MED ORDER — ALFUZOSIN HCL ER 10 MG PO TB24: 10.0000 mg | ORAL_TABLET | Freq: Two times a day (BID) | ORAL | 11 refills | Status: AC

## 2024-01-25 ENCOUNTER — Telehealth: Payer: Self-pay | Admitting: Urology

## 2024-01-25 NOTE — Telephone Encounter (Signed)
 Patient called with no answer. Message left to return call to office regarding medication refill.

## 2024-06-01 ENCOUNTER — Other Ambulatory Visit

## 2024-06-09 ENCOUNTER — Ambulatory Visit: Admitting: Urology
